# Patient Record
Sex: Female | Born: 1960 | Race: Black or African American | Hispanic: No | Marital: Single | Smoking: Former smoker
Health system: Southern US, Community
[De-identification: ages and names within clinical notes are randomized; demographics above are authoritative.]

## PROBLEM LIST (undated history)

## (undated) DIAGNOSIS — G43829 Menstrual migraine, not intractable, without status migrainosus: Secondary | ICD-10-CM

## (undated) DIAGNOSIS — K219 Gastro-esophageal reflux disease without esophagitis: Secondary | ICD-10-CM

## (undated) DIAGNOSIS — K449 Diaphragmatic hernia without obstruction or gangrene: Secondary | ICD-10-CM

## (undated) DIAGNOSIS — K635 Polyp of colon: Secondary | ICD-10-CM

## (undated) DIAGNOSIS — I10 Essential (primary) hypertension: Secondary | ICD-10-CM

## (undated) HISTORY — DX: Polyp of colon: K63.5

## (undated) HISTORY — DX: Gastro-esophageal reflux disease without esophagitis: K21.9

## (undated) HISTORY — PX: OTHER SURGICAL HISTORY: SHX169

## (undated) HISTORY — DX: Menstrual migraine, not intractable, without status migrainosus: G43.829

---

## 1991-01-20 HISTORY — PX: EXPLORATORY LAPAROTOMY: SUR591

## 1994-01-19 HISTORY — PX: ABDOMINAL HYSTERECTOMY: SHX81

## 2003-08-26 ENCOUNTER — Other Ambulatory Visit: Payer: Self-pay

## 2004-04-09 ENCOUNTER — Ambulatory Visit: Payer: Self-pay | Admitting: Internal Medicine

## 2004-05-09 ENCOUNTER — Ambulatory Visit: Payer: Self-pay | Admitting: Internal Medicine

## 2004-09-16 ENCOUNTER — Ambulatory Visit: Payer: Self-pay | Admitting: Internal Medicine

## 2004-10-31 ENCOUNTER — Ambulatory Visit: Payer: Self-pay | Admitting: Internal Medicine

## 2004-11-07 ENCOUNTER — Emergency Department: Payer: Self-pay | Admitting: Emergency Medicine

## 2004-12-25 ENCOUNTER — Ambulatory Visit: Payer: Self-pay | Admitting: Internal Medicine

## 2005-09-16 ENCOUNTER — Emergency Department: Payer: Self-pay | Admitting: Emergency Medicine

## 2005-09-16 ENCOUNTER — Other Ambulatory Visit: Payer: Self-pay

## 2006-05-21 ENCOUNTER — Ambulatory Visit: Payer: Self-pay | Admitting: Internal Medicine

## 2006-06-03 ENCOUNTER — Ambulatory Visit: Payer: Self-pay | Admitting: Internal Medicine

## 2006-07-13 ENCOUNTER — Other Ambulatory Visit: Payer: Self-pay

## 2006-07-13 ENCOUNTER — Emergency Department: Payer: Self-pay | Admitting: Emergency Medicine

## 2006-10-29 ENCOUNTER — Ambulatory Visit: Payer: Self-pay | Admitting: Unknown Physician Specialty

## 2006-12-03 ENCOUNTER — Ambulatory Visit: Payer: Self-pay | Admitting: Unknown Physician Specialty

## 2007-01-17 ENCOUNTER — Ambulatory Visit: Payer: Self-pay | Admitting: Physician Assistant

## 2007-01-26 ENCOUNTER — Ambulatory Visit: Payer: Self-pay | Admitting: Oncology

## 2007-07-20 ENCOUNTER — Ambulatory Visit: Payer: Self-pay | Admitting: Internal Medicine

## 2009-03-12 ENCOUNTER — Ambulatory Visit: Payer: Self-pay | Admitting: Internal Medicine

## 2010-03-07 ENCOUNTER — Other Ambulatory Visit: Payer: Self-pay | Admitting: Internal Medicine

## 2010-04-28 ENCOUNTER — Ambulatory Visit: Payer: Self-pay | Admitting: General Practice

## 2010-12-09 ENCOUNTER — Emergency Department: Payer: Self-pay | Admitting: Emergency Medicine

## 2010-12-31 ENCOUNTER — Ambulatory Visit: Payer: Self-pay | Admitting: Internal Medicine

## 2011-03-05 ENCOUNTER — Other Ambulatory Visit: Payer: Self-pay | Admitting: Internal Medicine

## 2011-03-05 LAB — CBC WITH DIFFERENTIAL/PLATELET
Basophil #: 0 10*3/uL (ref 0.0–0.1)
Eosinophil %: 1.7 %
HCT: 38.5 % (ref 35.0–47.0)
HGB: 12.6 g/dL (ref 12.0–16.0)
Lymphocyte #: 3 10*3/uL (ref 1.0–3.6)
MCV: 84 fL (ref 80–100)
Monocyte #: 0.5 10*3/uL (ref 0.0–0.7)
Monocyte %: 4.8 %
Neutrophil #: 5.9 10*3/uL (ref 1.4–6.5)
RDW: 14.9 % — ABNORMAL HIGH (ref 11.5–14.5)
WBC: 9.6 10*3/uL (ref 3.6–11.0)

## 2011-03-05 LAB — TSH: Thyroid Stimulating Horm: 0.824 u[IU]/mL

## 2011-03-05 LAB — URINALYSIS, COMPLETE
Bilirubin,UR: NEGATIVE
Glucose,UR: NEGATIVE mg/dL (ref 0–75)
Ketone: NEGATIVE
Leukocyte Esterase: NEGATIVE
Nitrite: NEGATIVE
Ph: 5 (ref 4.5–8.0)
Protein: NEGATIVE
Specific Gravity: 1.018 (ref 1.003–1.030)
WBC UR: 1 /HPF (ref 0–5)

## 2011-03-05 LAB — COMPREHENSIVE METABOLIC PANEL
Anion Gap: 5 — ABNORMAL LOW (ref 7–16)
Bilirubin,Total: 0.4 mg/dL (ref 0.2–1.0)
Chloride: 107 mmol/L (ref 98–107)
Co2: 28 mmol/L (ref 21–32)
Creatinine: 0.81 mg/dL (ref 0.60–1.30)
EGFR (African American): >60
EGFR (Non-African Amer.): >60
Osmolality: 279 (ref 275–301)
Potassium: 3.9 mmol/L (ref 3.5–5.1)
Sodium: 140 mmol/L (ref 136–145)

## 2011-03-05 LAB — LIPID PANEL
Cholesterol: 202 mg/dL — ABNORMAL HIGH (ref 0–200)
HDL Cholesterol: 52 mg/dL (ref 40–60)
Ldl Cholesterol, Calc: 133 mg/dL — ABNORMAL HIGH (ref 0–100)
Triglycerides: 83 mg/dL (ref 0–200)

## 2011-03-05 LAB — HEMOGLOBIN A1C: Hemoglobin A1C: 7.7 % — ABNORMAL HIGH (ref 4.2–6.3)

## 2012-02-19 ENCOUNTER — Ambulatory Visit: Payer: Self-pay | Admitting: Unknown Physician Specialty

## 2014-04-13 ENCOUNTER — Ambulatory Visit: Payer: Self-pay | Admitting: Family Medicine

## 2014-04-13 LAB — LIPID PANEL
Cholesterol: 198 mg/dL
HDL Cholesterol: 48 mg/dL
LDL CHOLESTEROL, CALC: 133 mg/dL — AB
TRIGLYCERIDES: 85 mg/dL
VLDL Cholesterol, Calc: 17 mg/dL

## 2014-04-13 LAB — COMPREHENSIVE METABOLIC PANEL
ALK PHOS: 80 U/L
ALT: 16 U/L
Albumin: 3.7 g/dL
Anion Gap: 6 — ABNORMAL LOW (ref 7–16)
BUN: 9 mg/dL
Bilirubin,Total: 0.4 mg/dL
CHLORIDE: 106 mmol/L
Calcium, Total: 9.1 mg/dL
Co2: 27 mmol/L
Creatinine: 0.71 mg/dL
EGFR (African American): >60
EGFR (Non-African Amer.): >60
GLUCOSE: 182 mg/dL — AB
Potassium: 3.8 mmol/L
SGOT(AST): 18 U/L
Sodium: 139 mmol/L
Total Protein: 7 g/dL

## 2014-04-13 LAB — HEMOGLOBIN A1C: Hemoglobin A1C: 7.4 % — ABNORMAL HIGH

## 2014-08-31 ENCOUNTER — Ambulatory Visit (INDEPENDENT_AMBULATORY_CARE_PROVIDER_SITE_OTHER): Payer: 59 | Admitting: Family Medicine

## 2014-08-31 ENCOUNTER — Encounter: Payer: Self-pay | Admitting: Family Medicine

## 2014-08-31 VITALS — BP 138/83 | HR 78 | Temp 97.7°F | Resp 16 | Ht 66.0 in | Wt 202.8 lb

## 2014-08-31 DIAGNOSIS — R1013 Epigastric pain: Secondary | ICD-10-CM | POA: Diagnosis not present

## 2014-08-31 DIAGNOSIS — K219 Gastro-esophageal reflux disease without esophagitis: Secondary | ICD-10-CM | POA: Insufficient documentation

## 2014-08-31 DIAGNOSIS — E119 Type 2 diabetes mellitus without complications: Secondary | ICD-10-CM | POA: Diagnosis not present

## 2014-08-31 MED ORDER — METFORMIN HCL 500 MG PO TABS: 500.0000 mg | ORAL_TABLET | Freq: Two times a day (BID) | ORAL | Status: DC

## 2014-08-31 MED ORDER — PANTOPRAZOLE SODIUM 40 MG PO TBEC: 40.0000 mg | DELAYED_RELEASE_TABLET | Freq: Every day | ORAL | Status: DC

## 2014-08-31 NOTE — Progress Notes (Signed)
Subjective:    Patient ID: Angela Griffith, female    DOB: [birth year], 54 y.o.   MRN: [id number]  HPI: Angela Griffith is a 54 y.o. female presenting on 08/31/2014 for Gastrophageal Reflux   Gastrophageal Reflux She complains of abdominal pain (epigastric), chest pain (chest pressure.), heartburn and nausea. This is a chronic problem. The problem has been gradually worsening. The heartburn is located in the substernum. The heartburn does not wake her from sleep. The heartburn does not limit her activity. The heartburn doesn't change with position. The symptoms are aggravated by certain foods and caffeine. Pertinent negatives include no anemia, fatigue or melena. Risk factors include smoking/tobacco exposure. She has tried nothing for the symptoms.   Pt reporting pain is located in the epigastrium.  At times the pain becomes a pressure and radiates up her chest to her head. She reports hearing her heartbeat in her ears.  This sensation is also accompanied by nausea.   Of note patient lab work that was never done in [facility] but appears in [facility] shows a HgA1c consistent with diabetes. Previous A1c was 7.7%.  Pt was unaware of diabetes diagnosis. No visual changes. No polydipsia, polyuria, or polyphagia. Occasional tingling in feet.   Past Medical History  Diagnosis Date  . GERD (gastroesophageal reflux disease)   . Colon polyps     last colonoscopy in 2015 nml  . Menstrual migraine     No current outpatient prescriptions on file prior to visit.   No current facility-administered medications on file prior to visit.    Review of Systems  Constitutional: Negative for fever, chills and fatigue.  HENT: Negative.   Cardiovascular: Positive for chest pain (chest pressure.) and palpitations.  Gastrointestinal: Positive for heartburn, nausea and abdominal pain (epigastric). Negative for vomiting, diarrhea, constipation, blood in stool and melena.  Endocrine: Negative for polydipsia,  polyphagia and polyuria.  Genitourinary: Negative.   Musculoskeletal: Negative.   Neurological: Negative for dizziness, weakness and light-headedness.  Psychiatric/Behavioral: Negative.    Per HPI unless specifically indicated above     Objective:    BP 138/83 mmHg  Pulse 78  Temp(Src) 97.7 F (36.5 C) (Oral)  Resp 16  Ht 5' 6"  (1.676 m)  Wt 202 lb 12.8 oz (91.989 kg)  BMI 32.75 kg/m2  LMP   Wt Readings from Last 3 Encounters:  08/31/14 202 lb 12.8 oz (91.989 kg)    Physical Exam  Constitutional: She is oriented to person, place, and time. She appears well-developed and well-nourished. No distress.  Neck: Normal range of motion. Neck supple. No thyromegaly present.  Cardiovascular: Normal rate and regular rhythm.  Exam reveals no gallop and no friction rub.   No murmur heard. Pulmonary/Chest: Effort normal and breath sounds normal. She exhibits no tenderness.  Abdominal: Soft. Bowel sounds are normal. There is no tenderness. There is no rebound.  Musculoskeletal: Normal range of motion. She exhibits no edema or tenderness.  Lymphadenopathy:    She has no cervical adenopathy.  Neurological: She is alert and oriented to person, place, and time.  Skin: Skin is warm and dry. She is not diaphoretic.   Diabetic Foot Exam - Simple   Simple Foot Form  Diabetic Foot exam was performed with the following findings:  Yes 08/31/2014  9:58 AM  Visual Inspection  No deformities, no ulcerations, no other skin breakdown bilaterally:  Yes  Sensation Testing  Intact to touch and monofilament testing bilaterally:  Yes  Pulse Check  Posterior Tibialis and  Dorsalis pulse intact bilaterally:  Yes  Comments      Results for orders placed or performed in visit on 04/13/14  Hemoglobin A1c  Result Value Ref Range   Hemoglobin A1C 7.4 (H) %  Comprehensive metabolic panel  Result Value Ref Range   Glucose, CSF DNP mg/dL   BUN 9 mg/dL   Creatinine 0.71 mg/dL   Sodium, Urine Random DNP  mmol/L   Potassium, Urine Random DNP mmol/L   Chloride, Urine Random DNP mmol/L   Co2 27 mmol/L   Calcium, Total 9.1 mg/dL   SGOT(AST) 18 U/L   SGPT (ALT) 16 U/L   Alkaline Phosphatase 80 U/L   Albumin 3.7 g/dL   Total Protein 7.0 g/dL   Bilirubin,Total 0.4 mg/dL   Anion Gap 6 (L) 7-16   EGFR (African American) >60    EGFR (Non-African Amer.) >60    Glucose 182 (H) mg/dL   Sodium 139 mmol/L   Potassium 3.8 mmol/L   Chloride 106 mmol/L  Lipid panel  Result Value Ref Range   Cholesterol 198 mg/dL   Triglycerides 85 mg/dL   HDL Cholesterol 48 mg/dL   VLDL Cholesterol, Calc 17 mg/dL   Ldl Cholesterol, Calc 133 (H) mg/dL      Assessment & Plan:   Problem List Items Addressed This Visit      Digestive   GERD without esophagitis - Primary    Start pantoprazole today. Reviewed trigger foods. Reviewed alarm symptoms of dysphagia, regurgitation, bloody vomit/stool.  RTC 2 weeks for check. Consider GI referral if not improving.       Relevant Medications   pantoprazole (PROTONIX) 40 MG tablet   Other Relevant Orders   CBC with Differential/Platelet     Endocrine   Type 2 diabetes mellitus    New onset- pt unaware of previous A1c. Reviewed diabetes care. Refer to [facility] for Diabetes education. Start Metformin BID.  Encouraged diet and lifestyle changes.  RTC 3 mos.       Relevant Medications   metFORMIN (GLUCOPHAGE) 500 MG tablet   Other Relevant Orders   Comprehensive Metabolic Panel (CMET)   Lipid Profile   HgB A1c   Microalbumin, urine   Ambulatory referral to diabetic education    Other Visit Diagnoses    Epigastric pain        EKG WNL. Check Amylase/lipase. Likely GERD. If symptoms do not improve with PPI- consider cardiology referal. RTC 2 weeks.     Relevant Orders    EKG 12-Lead    Lipase    Amylase       Meds ordered this encounter  Medications  . pantoprazole (PROTONIX) 40 MG tablet    Sig: Take 1 tablet (40 mg total) by mouth daily.     Dispense:  30 tablet    Refill:  11    Order Specific Question:  Supervising Provider    Answer:  Arlis Porta [phone]  . metFORMIN (GLUCOPHAGE) 500 MG tablet    Sig: Take 1 tablet (500 mg total) by mouth 2 (two) times daily with a meal.    Dispense:  60 tablet    Refill:  11    Order Specific Question:  Supervising Provider    Answer:  Arlis Porta [phone]      Follow up plan: Return in about 2 weeks (around 09/14/2014) for GERD. Marland Kitchen

## 2014-08-31 NOTE — Assessment & Plan Note (Signed)
New onset- pt unaware of previous A1c. Reviewed diabetes care. Refer to [facility] for Diabetes education. Start Metformin BID.  Encouraged diet and lifestyle changes.  RTC 3 mos.

## 2014-08-31 NOTE — Patient Instructions (Addendum)
Please seek immediate medical attention at ER or Urgent Care if you develop: Chest pain, pressure or tightness. Shortness of breath accompanied by nausea or diaphoresis Visual changes Numbness or tingling on one side of the body Facial droop Altered mental status Or any concerning symptoms.  We will try acid reflux medications for about 2 weeks. During that time if you experience chest pressure or palpitations- you must go to the ER.  If you are still having symptoms at your follow-up we will have you seen by cardiology.    We will refer you over to Diabetes education to learn more about your new diagnosis. Please get your eye exam this year.    Diabetes Mellitus and Food It is important for you to manage your blood sugar (glucose) level. Your blood glucose level can be greatly affected by what you eat. Eating healthier foods in the appropriate amounts throughout the day at about the same time each day will help you control your blood glucose level. It can also help slow or prevent worsening of your diabetes mellitus. Healthy eating may even help you improve the level of your blood pressure and reach or maintain a healthy weight.  HOW CAN FOOD AFFECT ME? Carbohydrates Carbohydrates affect your blood glucose level more than any other type of food. Your dietitian will help you determine how many carbohydrates to eat at each meal and teach you how to count carbohydrates. Counting carbohydrates is important to keep your blood glucose at a healthy level, especially if you are using insulin or taking certain medicines for diabetes mellitus. Alcohol Alcohol can cause sudden decreases in blood glucose (hypoglycemia), especially if you use insulin or take certain medicines for diabetes mellitus. Hypoglycemia can be a life-threatening condition. Symptoms of hypoglycemia (sleepiness, dizziness, and disorientation) are similar to symptoms of having too much alcohol.  If your health care provider has given  you approval to drink alcohol, do so in moderation and use the following guidelines:  Women should not have more than one drink per day, and men should not have more than two drinks per day. One drink is equal to:  12 oz of beer.  5 oz of wine.  1 oz of hard liquor.  Do not drink on an empty stomach.  Keep yourself hydrated. Have water, diet soda, or unsweetened iced tea.  Regular soda, juice, and other mixers might contain a lot of carbohydrates and should be counted. WHAT FOODS ARE NOT RECOMMENDED? As you make food choices, it is important to remember that all foods are not the same. Some foods have fewer nutrients per serving than other foods, even though they might have the same number of calories or carbohydrates. It is difficult to get your body what it needs when you eat foods with fewer nutrients. Examples of foods that you should avoid that are high in calories and carbohydrates but low in nutrients include:  Trans fats (most processed foods list trans fats on the Nutrition Facts label).  Regular soda.  Juice.  Candy.  Sweets, such as cake, pie, doughnuts, and cookies.  Fried foods. WHAT FOODS CAN I EAT? Have nutrient-rich foods, which will nourish your body and keep you healthy. The food you should eat also will depend on several factors, including:  The calories you need.  The medicines you take.  Your weight.  Your blood glucose level.  Your blood pressure level.  Your cholesterol level. You also should eat a variety of foods, including:  Protein, such as meat,  poultry, fish, tofu, nuts, and seeds (lean animal proteins are best).  Fruits.  Vegetables.  Dairy products, such as milk, cheese, and yogurt (low fat is best).  Breads, grains, pasta, cereal, rice, and beans.  Fats such as olive oil, trans fat-free margarine, canola oil, avocado, and olives. DOES EVERYONE WITH DIABETES MELLITUS HAVE THE SAME MEAL PLAN? Because every person with diabetes  mellitus is different, there is not one meal plan that works for everyone. It is very important that you meet with a dietitian who will help you create a meal plan that is just right for you. Document Released: 10/02/2004 Document Revised: 01/10/2013 Document Reviewed: 12/02/2012 [facility] Patient Information 2015 [place], [place]. This information is not intended to replace advice given to you by your health care provider. Make sure you discuss any questions you have with your health care provider. Diabetes Mellitus and Food It is important for you to manage your blood sugar (glucose) level. Your blood glucose level can be greatly affected by what you eat. Eating healthier foods in the appropriate amounts throughout the day at about the same time each day will help you control your blood glucose level. It can also help slow or prevent worsening of your diabetes mellitus. Healthy eating may even help you improve the level of your blood pressure and reach or maintain a healthy weight.  HOW CAN FOOD AFFECT ME? Carbohydrates Carbohydrates affect your blood glucose level more than any other type of food. Your dietitian will help you determine how many carbohydrates to eat at each meal and teach you how to count carbohydrates. Counting carbohydrates is important to keep your blood glucose at a healthy level, especially if you are using insulin or taking certain medicines for diabetes mellitus. Alcohol Alcohol can cause sudden decreases in blood glucose (hypoglycemia), especially if you use insulin or take certain medicines for diabetes mellitus. Hypoglycemia can be a life-threatening condition. Symptoms of hypoglycemia (sleepiness, dizziness, and disorientation) are similar to symptoms of having too much alcohol.  If your health care provider has given you approval to drink alcohol, do so in moderation and use the following guidelines:  Women should not have more than one drink per day, and men should not have  more than two drinks per day. One drink is equal to:  12 oz of beer.  5 oz of wine.  1 oz of hard liquor.  Do not drink on an empty stomach.  Keep yourself hydrated. Have water, diet soda, or unsweetened iced tea.  Regular soda, juice, and other mixers might contain a lot of carbohydrates and should be counted. WHAT FOODS ARE NOT RECOMMENDED? As you make food choices, it is important to remember that all foods are not the same. Some foods have fewer nutrients per serving than other foods, even though they might have the same number of calories or carbohydrates. It is difficult to get your body what it needs when you eat foods with fewer nutrients. Examples of foods that you should avoid that are high in calories and carbohydrates but low in nutrients include:  Trans fats (most processed foods list trans fats on the Nutrition Facts label).  Regular soda.  Juice.  Candy.  Sweets, such as cake, pie, doughnuts, and cookies.  Fried foods. WHAT FOODS CAN I EAT? Have nutrient-rich foods, which will nourish your body and keep you healthy. The food you should eat also will depend on several factors, including:  The calories you need.  The medicines you take.  Your  weight.  Your blood glucose level.  Your blood pressure level.  Your cholesterol level. You also should eat a variety of foods, including:  Protein, such as meat, poultry, fish, tofu, nuts, and seeds (lean animal proteins are best).  Fruits.  Vegetables.  Dairy products, such as milk, cheese, and yogurt (low fat is best).  Breads, grains, pasta, cereal, rice, and beans.  Fats such as olive oil, trans fat-free margarine, canola oil, avocado, and olives. DOES EVERYONE WITH DIABETES MELLITUS HAVE THE SAME MEAL PLAN? Because every person with diabetes mellitus is different, there is not one meal plan that works for everyone. It is very important that you meet with a dietitian who will help you create a meal plan  that is just right for you. Document Released: 10/02/2004 Document Revised: 01/10/2013 Document Reviewed: 12/02/2012 [facility] Patient Information 2015 [place], [place]. This information is not intended to replace advice given to you by your health care provider. Make sure you discuss any questions you have with your health care provider.

## 2014-08-31 NOTE — Assessment & Plan Note (Signed)
Start pantoprazole today. Reviewed trigger foods. Reviewed alarm symptoms of dysphagia, regurgitation, bloody vomit/stool.  RTC 2 weeks for check. Consider GI referral if not improving.

## 2014-09-06 ENCOUNTER — Other Ambulatory Visit
Admission: RE | Admit: 2014-09-06 | Discharge: 2014-09-06 | Disposition: A | Discharge status: Home / Self Care | Payer: 59 | Source: Ambulatory Visit | Attending: Family Medicine | Admitting: Family Medicine

## 2014-09-06 DIAGNOSIS — R1013 Epigastric pain: Secondary | ICD-10-CM | POA: Insufficient documentation

## 2014-09-06 DIAGNOSIS — E119 Type 2 diabetes mellitus without complications: Secondary | ICD-10-CM | POA: Insufficient documentation

## 2014-09-06 DIAGNOSIS — K219 Gastro-esophageal reflux disease without esophagitis: Secondary | ICD-10-CM | POA: Insufficient documentation

## 2014-09-07 DIAGNOSIS — R1013 Epigastric pain: Secondary | ICD-10-CM | POA: Diagnosis present

## 2014-09-07 DIAGNOSIS — E119 Type 2 diabetes mellitus without complications: Secondary | ICD-10-CM | POA: Diagnosis present

## 2014-09-07 DIAGNOSIS — K219 Gastro-esophageal reflux disease without esophagitis: Secondary | ICD-10-CM | POA: Diagnosis not present

## 2014-09-07 LAB — COMPREHENSIVE METABOLIC PANEL
ALK PHOS: 79 U/L (ref 38–126)
ALT: 17 U/L (ref 14–54)
AST: 24 U/L (ref 15–41)
Albumin: 3.9 g/dL (ref 3.5–5.0)
Anion gap: 6 (ref 5–15)
BUN: 11 mg/dL (ref 6–20)
CALCIUM: 9 mg/dL (ref 8.9–10.3)
CO2: 27 mmol/L (ref 22–32)
CREATININE: 0.77 mg/dL (ref 0.44–1.00)
Chloride: 106 mmol/L (ref 101–111)
Glucose, Bld: 123 mg/dL — ABNORMAL HIGH (ref 65–99)
Potassium: 4 mmol/L (ref 3.5–5.1)
Sodium: 139 mmol/L (ref 135–145)
Total Bilirubin: 0.4 mg/dL (ref 0.3–1.2)
Total Protein: 7.4 g/dL (ref 6.5–8.1)

## 2014-09-07 LAB — CBC WITH DIFFERENTIAL/PLATELET
BASOS ABS: 0.1 10*3/uL (ref 0–0.1)
Basophils Relative: 1 %
EOS ABS: 0.1 10*3/uL (ref 0–0.7)
EOS PCT: 1 %
HCT: 39.5 % (ref 35.0–47.0)
HEMOGLOBIN: 12.7 g/dL (ref 12.0–16.0)
LYMPHS ABS: 3.4 10*3/uL (ref 1.0–3.6)
Lymphocytes Relative: 32 %
MCH: 26.1 pg (ref 26.0–34.0)
MCHC: 32.1 g/dL (ref 32.0–36.0)
MCV: 81.4 fL (ref 80.0–100.0)
Monocytes Absolute: 0.7 10*3/uL (ref 0.2–0.9)
Monocytes Relative: 7 %
NEUTROS PCT: 59 %
Neutro Abs: 6.4 10*3/uL (ref 1.4–6.5)
PLATELETS: 340 10*3/uL (ref 150–440)
RBC: 4.85 MIL/uL (ref 3.80–5.20)
RDW: 14.7 % — ABNORMAL HIGH (ref 11.5–14.5)
WBC: 10.7 10*3/uL (ref 3.6–11.0)

## 2014-09-07 LAB — LIPID PANEL
Cholesterol: 215 mg/dL — ABNORMAL HIGH (ref 0–200)
HDL: 56 mg/dL (ref >40)
LDL CALC: 148 mg/dL — AB (ref 0–99)
TRIGLYCERIDES: 55 mg/dL (ref <150)
Total CHOL/HDL Ratio: 3.8 RATIO
VLDL: 11 mg/dL (ref 0–40)

## 2014-09-07 LAB — LIPASE, BLOOD: LIPASE: 20 U/L — AB (ref 22–51)

## 2014-09-07 LAB — HEMOGLOBIN A1C: HEMOGLOBIN A1C: 7.6 % — AB (ref 4.0–6.0)

## 2014-09-07 LAB — AMYLASE: AMYLASE: 53 U/L (ref 28–100)

## 2014-09-08 LAB — MICROALBUMIN, URINE: MICROALB UR: 10 ug/mL — AB

## 2014-09-10 ENCOUNTER — Telehealth: Payer: Self-pay | Admitting: Family Medicine

## 2014-09-10 ENCOUNTER — Ambulatory Visit: Payer: Self-pay | Admitting: *Deleted

## 2014-09-10 NOTE — Telephone Encounter (Signed)
Called pt to review lab results. LMTCB.   HgA1c is 7.6%- still consistent with diabetes. We will continue the treatment we started at your last visit. Kidney function and liver function are doing well. Your urine does show so microscopic protein- this is common in diabetes.  The treatment for this is placing you on a blood pressure medication called Lisinopril or Losartan to protect your kidneys. We will talk more about this at your visit.  CBC: No anemia of signs of bleeding from acid reflux.

## 2014-09-11 NOTE — Telephone Encounter (Signed)
Pt made aware of lab results. Protein in kidneys and possible start of bp medication To be discussed at next visit.

## 2014-09-13 ENCOUNTER — Ambulatory Visit: Payer: Self-pay | Admitting: *Deleted

## 2014-09-14 ENCOUNTER — Ambulatory Visit: Payer: 59 | Admitting: Family Medicine

## 2014-09-17 ENCOUNTER — Ambulatory Visit: Payer: Self-pay | Admitting: *Deleted

## 2014-09-18 ENCOUNTER — Telehealth: Payer: Self-pay

## 2014-09-18 NOTE — Telephone Encounter (Signed)
Agree, as we discussed.-jh 

## 2014-09-18 NOTE — Telephone Encounter (Signed)
Pt was feeling dizzy, jittery  Thinks may be the  side effect of  Metformin that she start on 09/13/2014 and As per Dr. Juanetta Gosling she can take once daily instead of twice daily and make sure has enough food because pt is not taking with enough food we also mentioned  to check her sugar level when she is feeling that way and she checked her sugar was 121 with 2 cracker and cup of coffee and wanted to be seen today --as per Dr. Juanetta Gosling we can see her Thursday and if she feels really sick today she can be seen by ER or Urgent care pt understood very well.

## 2014-09-21 ENCOUNTER — Ambulatory Visit: Payer: Self-pay | Admitting: *Deleted

## 2014-10-05 ENCOUNTER — Encounter: Payer: Self-pay | Admitting: *Deleted

## 2014-10-05 ENCOUNTER — Encounter: Payer: 59 | Attending: Family Medicine | Admitting: *Deleted

## 2014-10-05 VITALS — BP 144/90 | Ht 65.0 in | Wt 202.2 lb

## 2014-10-05 DIAGNOSIS — E119 Type 2 diabetes mellitus without complications: Secondary | ICD-10-CM | POA: Diagnosis not present

## 2014-10-05 NOTE — Progress Notes (Signed)
Diabetes Self-Management Education  Visit Type: First/Initial  Appt. Start Time: 1335 Appt. End Time: 1450  10/05/2014  Ms. Angela Griffith, identified by name and date of birth, is a 54 y.o. female with a diagnosis of Diabetes: Type 2.   ASSESSMENT  Blood pressure 144/90, height  (1.651 m), weight 202 lb 3.2 oz (91.717 kg). Body mass index is 33.65 kg/(m^2).      Diabetes Self-Management Education - 10/05/14 1522    Visit Information   Visit Type First/Initial   Initial Visit   Diabetes Type Type 2   Are you currently following a meal plan? Yes   What type of meal plan do you follow? eating more fruits and vegetables; not eating late at night   Are you taking your medications as prescribed? Yes  Someitmes she only takes 1 Metformin per day due to "not feeling well when I take 1000 mg"   Date Diagnosed 3 weeks ago   Health Coping   How would you rate your overall health? Good   Psychosocial Assessment   Patient Belief/Attitude about Diabetes Motivated to manage diabetes  "not worried"   Self-care barriers None   Self-management support Friends;Family   Patient Concerns Nutrition/Meal planning;Medication;Monitoring;Glycemic Control;Weight Control;Healthy Lifestyle   Special Needs None   Preferred Learning Style Hands on;Auditory   Learning Readiness Change in progress   How often do you need to have someone help you when you read instructions, pamphlets, or other written materials from your doctor or pharmacy? 1 - Never   What is the last grade level you completed in school? college   Complications   Last HgB A1C per patient/outside source 7.6 %  09/07/14   How often do you check your blood sugar? 0 times/day (not testing)  Provided FreeStyle Freedom Lite meter and instructed on use. BG upon return demonstration was 87 mg/dL at 1:61 pm - 3 hrs pp.     Have you had a dilated eye exam in the past 12 months? No   Have you had a dental exam in the past 12 months? No   Are you checking your feet? Yes   How many days per week are you checking your feet? 1   Dietary Intake   Breakfast skips meals; oatmeal; bacon, egg and cheese with wheat toast; peanut butter crackers or cheese crackers   Lunch [place] burger and occasional [place] fries; chicken pie; pasta, [place] wrap   Dinner spaghetti; veggies; [place] sandwich   Beverage(s) water, juices   Exercise   Exercise Type Light (walking / raking leaves)   How many days per week to you exercise? 2   How many minutes per day do you exercise? 30   Total minutes per week of exercise 60   Patient Education   Previous Diabetes Education No   Disease state  Definition of diabetes, type 1 and 2, and the diagnosis of diabetes;Factors that contribute to the development of diabetes   Nutrition management  Role of diet in the treatment of diabetes and the relationship between the three main macronutrients and blood glucose level   Physical activity and exercise  Role of exercise on diabetes management, blood pressure control and cardiac health.   Medications Reviewed patients medication for diabetes, action, purpose, timing of dose and side effects.   Monitoring Taught/evaluated SMBG meter.;Purpose and frequency of SMBG.;Identified appropriate SMBG and/or A1C goals.   Chronic complications Relationship between chronic complications and blood glucose control   Psychosocial adjustment Identified and addressed patients  feelings and concerns about diabetes   Individualized Goals (developed by patient)   Reducing Risk Improve blood sugars Decrease medications Lose weight Lead a healthier lifestyle Become more fit   Outcomes   Expected Outcomes Demonstrated interest in learning. Expect positive outcomes      Individualized Plan for Diabetes Self-Management Training:   Learning Objective:  Patient will have a greater understanding of diabetes self-management. Patient education plan is to attend individual and/or group  sessions per assessed needs and concerns.   Plan:   Patient Instructions  Check blood sugars 2 x day before breakfast and 2 hrs after supper 3-4 x week Exercise: Continue at gym for  30 minutes 2 days a week and gradually increase to 30 minutes 5 x week Avoid sugar sweetened drinks (juices) Don't skip meals Eat 3 meals day,   1-2  snacks a day Space meals 4-6 hours apart Make an eye doctor appointment Bring blood sugar records to the next class Call your doctor for a prescription for:  1. Meter strips (type) FreeStyle Lite checking  1 times per day  2. Lancets (type) FreeStyle checking  1   times per day   Expected Outcomes:  Demonstrated interest in learning. Expect positive outcomes  Education material provided:  General Meal Planning Guidelines Meter - FreeStyle Freedom Lite  If problems or questions, patient to contact team via:   Sharion Settler, RN, CCM, CDE [phone]  Future DSME appointment:  December 17, 2014 for Class 1

## 2014-10-05 NOTE — Patient Instructions (Addendum)
Check blood sugars 2 x day before breakfast and 2 hrs after supper 3-4 x week Exercise: Continue at gym for  30 minutes 2 days a week and gradually increase to 30 minutes 5 x week Avoid sugar sweetened drinks (juices) Don't skip meals Eat 3 meals day,   1-2  snacks a day Space meals 4-6 hours apart Make an eye doctor appointment Bring blood sugar records to the next class Call your doctor for a prescription for:  1. Meter strips (type) FreeStyle Lite checking  1 times per day  2. Lancets (type) FreeStyle checking  1   times per day

## 2014-10-23 ENCOUNTER — Encounter: Payer: Self-pay | Admitting: *Deleted

## 2014-10-23 ENCOUNTER — Emergency Department
Admission: EM | Admit: 2014-10-23 | Discharge: 2014-10-24 | Disposition: A | Discharge status: Home / Self Care | Payer: 59 | Attending: Emergency Medicine | Admitting: Emergency Medicine

## 2014-10-23 ENCOUNTER — Other Ambulatory Visit: Payer: Self-pay

## 2014-10-23 ENCOUNTER — Emergency Department: Payer: 59

## 2014-10-23 DIAGNOSIS — I159 Secondary hypertension, unspecified: Secondary | ICD-10-CM | POA: Diagnosis not present

## 2014-10-23 DIAGNOSIS — Z79899 Other long term (current) drug therapy: Secondary | ICD-10-CM | POA: Diagnosis not present

## 2014-10-23 DIAGNOSIS — R109 Unspecified abdominal pain: Secondary | ICD-10-CM | POA: Insufficient documentation

## 2014-10-23 DIAGNOSIS — R079 Chest pain, unspecified: Secondary | ICD-10-CM | POA: Diagnosis present

## 2014-10-23 DIAGNOSIS — E119 Type 2 diabetes mellitus without complications: Secondary | ICD-10-CM | POA: Insufficient documentation

## 2014-10-23 DIAGNOSIS — Z87891 Personal history of nicotine dependence: Secondary | ICD-10-CM | POA: Diagnosis not present

## 2014-10-23 LAB — BASIC METABOLIC PANEL
ANION GAP: 8 (ref 5–15)
BUN: 17 mg/dL (ref 6–20)
CHLORIDE: 106 mmol/L (ref 101–111)
CO2: 25 mmol/L (ref 22–32)
Calcium: 9.1 mg/dL (ref 8.9–10.3)
Creatinine, Ser: 0.81 mg/dL (ref 0.44–1.00)
GFR calc non Af Amer: >60 mL/min (ref >60)
Glucose, Bld: 148 mg/dL — ABNORMAL HIGH (ref 65–99)
Potassium: 3.6 mmol/L (ref 3.5–5.1)
Sodium: 139 mmol/L (ref 135–145)

## 2014-10-23 LAB — URINALYSIS COMPLETE WITH MICROSCOPIC (ARMC ONLY)
Bilirubin Urine: NEGATIVE
Glucose, UA: NEGATIVE mg/dL
KETONES UR: NEGATIVE mg/dL
LEUKOCYTES UA: NEGATIVE
Nitrite: NEGATIVE
PH: 7 (ref 5.0–8.0)
PROTEIN: NEGATIVE mg/dL
Specific Gravity, Urine: 1.003 — ABNORMAL LOW (ref 1.005–1.030)

## 2014-10-23 LAB — CBC
HEMATOCRIT: 38.3 % (ref 35.0–47.0)
HEMOGLOBIN: 12.2 g/dL (ref 12.0–16.0)
MCH: 25.9 pg — AB (ref 26.0–34.0)
MCHC: 31.8 g/dL — ABNORMAL LOW (ref 32.0–36.0)
MCV: 81.5 fL (ref 80.0–100.0)
Platelets: 357 10*3/uL (ref 150–440)
RBC: 4.7 MIL/uL (ref 3.80–5.20)
RDW: 14.8 % — ABNORMAL HIGH (ref 11.5–14.5)
WBC: 14.5 10*3/uL — ABNORMAL HIGH (ref 3.6–11.0)

## 2014-10-23 LAB — TROPONIN I: Troponin I: <0.03 ng/mL (ref <0.031)

## 2014-10-23 NOTE — ED Provider Notes (Signed)
[facility] Emergency Department Provider Note  ____________________________________________  Time seen: Approximately 11:15 PM  I have reviewed the triage vital signs and the nursing notes.   HISTORY  Chief Complaint Palpitations and Chest Pain    HPI Angela Griffith is a 54 y.o. female who presents to the ED from home with a chief complaint of "throbbing" in epigastrium and lower chest. Patient has experienced symptoms previously; this evening she was "running around" and experienced symptoms onset approximately 6 PM. Describes a sensation of burning and throbbing starting in her epigastrium and lower chest. Denies sensation of sharp, stabbing, tearing pain. Denies sensation of pressure or tightness. Does not radiate to her arms, shoulders or neck. Pain in epigastrium wraps around to her back. At times patient feels her pulse throbbing in her head as well as occasional palpitation which she describes as "a hard thump". Patient recently started on metformin and has had issues with adjusting her dosage secondary to side effects of nausea. She is currentlyon 500 mg twice daily. She admits that she does not take her pantoprazole scheduled as directed by her doctor. Denies recent fever, chills, cough, abdominal pain, nausea, vomiting, diarrhea. Denies recent travel or trauma. Denies hormone use. Denies use of energy drinks or excessive caffeine. Nothing makes her symptoms worse. Resting makes her symptoms slightly better.   Past Medical History  Diagnosis Date  . GERD (gastroesophageal reflux disease)   . Colon polyps     last colonoscopy in 2015 nml  . Menstrual migraine   . Diabetes mellitus without complication [facility])     Patient Active Problem List   Diagnosis Date Noted  . GERD without esophagitis 08/31/2014  . Type 2 diabetes mellitus (HCC) 08/31/2014    Past Surgical History  Procedure Laterality Date  . Abdominal hysterectomy    . Exploratory  laparotomy  1993    for endometriosis  . Cesarean section  1985 and 1988    Current Outpatient Rx  Name  Route  Sig  Dispense  Refill  . metFORMIN (GLUCOPHAGE) 500 MG tablet   Oral   Take 1 tablet (500 mg total) by mouth 2 (two) times daily with a meal.   60 tablet   11   . pantoprazole (PROTONIX) 40 MG tablet   Oral   Take 1 tablet (40 mg total) by mouth daily.   30 tablet   11     Allergies Sulfa antibiotics; Theophyllines; Codeine; and Demerol  Family History  Problem Relation Age of Onset  . Deep vein thrombosis Father   . Cancer Father     colon  . Stroke Father   . Anuerysm Maternal Grandmother   . Diabetes Mother   . Diabetes Maternal Aunt   . Diabetes Maternal Uncle     Social History Social History  Substance Use Topics  . Smoking status: Former Smoker -- 0.25 packs/day for 34 years    Types: Cigarettes    Quit date: 01/19/2014  . Smokeless tobacco: Former Neurosurgeon  . Alcohol Use: No    Review of Systems Constitutional: No fever/chills Eyes: No visual changes. ENT: No sore throat. Cardiovascular: Positive for chest pain. Respiratory: Denies shortness of breath. Gastrointestinal: Positive for abdominal pain.  No nausea, no vomiting.  No diarrhea.  No constipation. Genitourinary: Negative for dysuria. Musculoskeletal: Negative for back pain. Skin: Negative for rash. Neurological: Negative for headaches, focal weakness or numbness.  10-point ROS otherwise negative.  ____________________________________________   PHYSICAL EXAM:  VITAL SIGNS: ED  Triage Vitals  Enc Vitals Group     BP 10/23/14 2156 183/102 mmHg     Pulse Rate 10/23/14 2156 89     Resp 10/23/14 2156 18     Temp 10/23/14 2156 98.4 F (36.9 C)     Temp Source 10/23/14 2156 Oral     SpO2 10/23/14 2156 98 %     Weight --      Height --      Head Cir --      Peak Flow --      Pain Score 10/23/14 2150 6     Pain Loc --      Pain Edu? --      Excl. in GC? --      Constitutional: Alert and oriented. Well appearing and in no acute distress. Eyes: Conjunctivae are normal. PERRL. EOMI. Head: Atraumatic. Nose: No congestion/rhinnorhea. Mouth/Throat: Mucous membranes are moist.  Oropharynx non-erythematous. Neck: No stridor. No carotid bruits. Cardiovascular: Normal rate, regular rhythm. Grossly normal heart sounds.  Good peripheral circulation. Respiratory: Normal respiratory effort.  No retractions. Lungs CTAB. Gastrointestinal: Soft and mildly tender to epigastrium without rebound or guarding. No distention. No abdominal bruits. No CVA tenderness. Musculoskeletal: No lower extremity tenderness nor edema.  No joint effusions. Neurologic:  Normal speech and language. No gross focal neurologic deficits are appreciated. No gait instability. Skin:  Skin is warm, dry and intact. No rash noted. Psychiatric: Mood and affect are normal. Speech and behavior are normal.  ____________________________________________   LABS (all labs ordered are listed, but only abnormal results are displayed)  Labs Reviewed  BASIC METABOLIC PANEL - Abnormal; Notable for the following:    Glucose, Bld 148 (*)    All other components within normal limits  CBC - Abnormal; Notable for the following:    WBC 14.5 (*)    MCH 25.9 (*)    MCHC 31.8 (*)    RDW 14.8 (*)    All other components within normal limits  URINALYSIS COMPLETEWITH MICROSCOPIC ([facility] ONLY) - Abnormal; Notable for the following:    Color, Urine COLORLESS (*)    APPearance CLEAR (*)    Specific Gravity, Urine 1.003 (*)    Hgb urine dipstick 1+ (*)    Bacteria, UA RARE (*)    Squamous Epithelial / LPF 0-5 (*)    All other components within normal limits  HEPATIC FUNCTION PANEL - Abnormal; Notable for the following:    Bilirubin, Direct <0.1 (*)    All other components within normal limits  TROPONIN I  LIPASE, BLOOD   ____________________________________________  EKG  ED ECG REPORT I, Ellayna Hilligoss  J, the attending physician, personally viewed and interpreted this ECG.   Date: 10/24/2014  EKG Time: 2148  Rate: 91  Rhythm: normal EKG, normal sinus rhythm  Axis: Normal  Intervals:none  ST&T Change: Nonspecific  ____________________________________________  RADIOLOGY  Chest 2 view (viewed by me, interpreted per Dr. Cherly Hensen): No acute cardiopulmonary process seen.  US Abdomen Limited RUQ interpreted per Dr. Cherly Hensen: Unremarkable ultrasound of the right upper quadrant.  ____________________________________________   PROCEDURES  Procedure(s) performed: None  Critical Care performed: No  ____________________________________________   INITIAL IMPRESSION / ASSESSMENT AND PLAN / ED COURSE  Pertinent labs & imaging results that were available during my care of the patient were reviewed by me and considered in my medical decision making (see chart for details).  54 year old female who presents with epigastric burning and substernal chest pain. She is hypertensive and mildly tender to  palpation on exam in her epigastrium. Will initiate symptomatic treatment with IV Protonix; obtain ultrasound to evaluate for cholecystitis, and reassess.  ----------------------------------------- 2:30 AM on 10/24/2014 -----------------------------------------  Patient resting in no acute distress. States she feels better overall. Discussed with patient persistently elevated blood pressure; specifically discussed starting HCTZ which she agrees to starting a low dose. Will start HCTZ 25 mg daily and patient will follow-up with her PCP. Strict return precautions given. Patient verbalizes understanding and agrees with plan of care. ____________________________________________   FINAL CLINICAL IMPRESSION(S) / ED DIAGNOSES  Final diagnoses:  Secondary hypertension, unspecified      Irean Hong, MD 10/24/14 0740

## 2014-10-23 NOTE — ED Notes (Addendum)
Pt reports onset of throbbing in upper abdomen and chest this afternoon/evening. Also feels at times has had palpitations. HR 91 in triage. States pain is throbbing in epigastric region into lower chest.

## 2014-10-23 NOTE — ED Notes (Signed)
Patient has "throbbing" in her chest. Has started on metformin and omeprazole.

## 2014-10-24 ENCOUNTER — Emergency Department: Payer: 59

## 2014-10-24 LAB — HEPATIC FUNCTION PANEL
ALBUMIN: 3.9 g/dL (ref 3.5–5.0)
ALT: 17 U/L (ref 14–54)
AST: 21 U/L (ref 15–41)
Alkaline Phosphatase: 67 U/L (ref 38–126)
BILIRUBIN TOTAL: 0.5 mg/dL (ref 0.3–1.2)
Total Protein: 7.3 g/dL (ref 6.5–8.1)

## 2014-10-24 LAB — LIPASE, BLOOD: Lipase: 26 U/L (ref 22–51)

## 2014-10-24 MED ORDER — PANTOPRAZOLE SODIUM 40 MG IV SOLR
40.0000 mg | Freq: Once | INTRAVENOUS | Status: AC
  Administered 2014-10-24: 40 mg via INTRAVENOUS
  Filled 2014-10-24: qty 40

## 2014-10-24 MED ORDER — HYDROCHLOROTHIAZIDE 25 MG PO TABS: 25.0000 mg | ORAL_TABLET | Freq: Every day | ORAL | Status: DC

## 2014-10-24 NOTE — Discharge Instructions (Signed)
1. Start blood pressure medicine daily (HCTZ 25 mg #30). 2. Return to the ER for recurrent or worsening symptoms, persistent vomiting, difficulty breathing or other concerns.  Hypertension Hypertension, commonly called high blood pressure, is when the force of blood pumping through your arteries is too strong. Your arteries are the blood vessels that carry blood from your heart throughout your body. A blood pressure reading consists of a higher number over a lower number, such as 110/72. The higher number (systolic) is the pressure inside your arteries when your heart pumps. The lower number (diastolic) is the pressure inside your arteries when your heart relaxes. Ideally you want your blood pressure below 120/80. Hypertension forces your heart to work harder to pump blood. Your arteries may become narrow or stiff. Having untreated or uncontrolled hypertension can cause heart attack, stroke, kidney disease, and other problems. RISK FACTORS Some risk factors for high blood pressure are controllable. Others are not.  Risk factors you cannot control include:   Race. You may be at higher risk if you are African American.  Age. Risk increases with age.  Gender. Men are at higher risk than women before age 67 years. After age 107, women are at higher risk than men. Risk factors you can control include:  Not getting enough exercise or physical activity.  Being overweight.  Getting too much fat, sugar, calories, or salt in your diet.  Drinking too much alcohol. SIGNS AND SYMPTOMS Hypertension does not usually cause signs or symptoms. Extremely high blood pressure (hypertensive crisis) may cause headache, anxiety, shortness of breath, and nosebleed. DIAGNOSIS To check if you have hypertension, your health care provider will measure your blood pressure while you are seated, with your arm held at the level of your heart. It should be measured at least twice using the same arm. Certain conditions can  cause a difference in blood pressure between your right and left arms. A blood pressure reading that is higher than normal on one occasion does not mean that you need treatment. If it is not clear whether you have high blood pressure, you may be asked to return on a different day to have your blood pressure checked again. Or, you may be asked to monitor your blood pressure at home for 1 or more weeks. TREATMENT Treating high blood pressure includes making lifestyle changes and possibly taking medicine. Living a healthy lifestyle can help lower high blood pressure. You may need to change some of your habits. Lifestyle changes may include:  Following the DASH diet. This diet is high in fruits, vegetables, and whole grains. It is low in salt, red meat, and added sugars.  Keep your sodium intake below 2,300 mg per day.  Getting at least 30-45 minutes of aerobic exercise at least 4 times per week.  Losing weight if necessary.  Not smoking.  Limiting alcoholic beverages.  Learning ways to reduce stress. Your health care provider may prescribe medicine if lifestyle changes are not enough to get your blood pressure under control, and if one of the following is true:  You are 30-60 years of age and your systolic blood pressure is above 140.  You are 66 years of age or older, and your systolic blood pressure is above 150.  Your diastolic blood pressure is above 90.  You have diabetes, and your systolic blood pressure is over 140 or your diastolic blood pressure is over 90.  You have kidney disease and your blood pressure is above 140/90.  You have heart  disease and your blood pressure is above 140/90. Your personal target blood pressure may vary depending on your medical conditions, your age, and other factors. HOME CARE INSTRUCTIONS  Have your blood pressure rechecked as directed by your health care provider.   Take medicines only as directed by your health care provider. Follow the  directions carefully. Blood pressure medicines must be taken as prescribed. The medicine does not work as well when you skip doses. Skipping doses also puts you at risk for problems.  Do not smoke.   Monitor your blood pressure at home as directed by your health care provider. SEEK MEDICAL CARE IF:   You think you are having a reaction to medicines taken.  You have recurrent headaches or feel dizzy.  You have swelling in your ankles.  You have trouble with your vision. SEEK IMMEDIATE MEDICAL CARE IF:  You develop a severe headache or confusion.  You have unusual weakness, numbness, or feel faint.  You have severe chest or abdominal pain.  You vomit repeatedly.  You have trouble breathing. MAKE SURE YOU:   Understand these instructions.  Will watch your condition.  Will get help right away if you are not doing well or get worse.   This information is not intended to replace advice given to you by your health care provider. Make sure you discuss any questions you have with your health care provider.   Document Released: 01/05/2005 Document Revised: 05/22/2014 Document Reviewed: 10/28/2012 Elsevier Interactive Patient Education [employer]

## 2014-10-26 ENCOUNTER — Ambulatory Visit (INDEPENDENT_AMBULATORY_CARE_PROVIDER_SITE_OTHER): Payer: 59 | Admitting: Family Medicine

## 2014-10-26 ENCOUNTER — Encounter: Payer: Self-pay | Admitting: Family Medicine

## 2014-10-26 VITALS — BP 160/100 | HR 72 | Temp 98.4°F | Resp 16 | Ht 66.0 in | Wt 203.6 lb

## 2014-10-26 DIAGNOSIS — K219 Gastro-esophageal reflux disease without esophagitis: Secondary | ICD-10-CM | POA: Diagnosis not present

## 2014-10-26 DIAGNOSIS — E119 Type 2 diabetes mellitus without complications: Secondary | ICD-10-CM | POA: Diagnosis not present

## 2014-10-26 DIAGNOSIS — I10 Essential (primary) hypertension: Secondary | ICD-10-CM | POA: Insufficient documentation

## 2014-10-26 MED ORDER — LISINOPRIL 20 MG PO TABS: 20.0000 mg | ORAL_TABLET | Freq: Every day | ORAL | Status: DC

## 2014-10-26 MED ORDER — SITAGLIPTIN PHOSPHATE 50 MG PO TABS: 50.0000 mg | ORAL_TABLET | Freq: Every day | ORAL | Status: DC

## 2014-10-26 NOTE — Patient Instructions (Addendum)
Discontinue Metformin and HCTZ.  To get flu shot at work Doctor, hospital).

## 2014-10-26 NOTE — Progress Notes (Signed)
Name: Angela Griffith   MRN: [id number]    DOB: [birth year]   Date:10/26/2014       Progress Note  Subjective  Chief Complaint  Chief Complaint  Patient presents with  . Hospitalization Follow-up    pt was in ER 10 /04 with throbbing pressure and tightness and they diagnosed a side effect from Metformin since she was stated with 1000 mg and ER suggest to take half of it and take B/P pill     HPI Here for f/u after ER visit for nausea and abd. Fullness from Metformin.  She had dropped dose from 1000 mg/d down to 500 mg./ day 3 weeks ago.  Then she stopped it altogether 3 days ago because of continued nausea and stomach discomfort.  She was also started on HCTZ 3 days ago.  Took 1 todayh.  Has concerns about that also.    No problem-specific assessment & plan notes found for this encounter.   Past Medical History  Diagnosis Date  . GERD (gastroesophageal reflux disease)   . Colon polyps     last colonoscopy in 2015 nml  . Menstrual migraine   . Diabetes mellitus without complication [facility])     Social History  Substance Use Topics  . Smoking status: Former Smoker -- 0.25 packs/day for 34 years    Types: Cigarettes    Quit date: 01/19/2014  . Smokeless tobacco: Former Systems developer  . Alcohol Use: No     Current outpatient prescriptions:  .  hydrochlorothiazide (HYDRODIURIL) 25 MG tablet, Take 1 tablet (25 mg total) by mouth daily., Disp: 30 tablet, Rfl: 0 .  metFORMIN (GLUCOPHAGE) 500 MG tablet, Take 1 tablet (500 mg total) by mouth 2 (two) times daily with a meal., Disp: 60 tablet, Rfl: 11 .  pantoprazole (PROTONIX) 40 MG tablet, Take 1 tablet (40 mg total) by mouth daily., Disp: 30 tablet, Rfl: 11  Allergies  Allergen Reactions  . Sulfa Antibiotics Hives  . Theophyllines Hives  . Codeine Nausea Only  . Demerol [Meperidine] Nausea And Vomiting    Review of Systems  Constitutional: Negative for fever, chills, weight loss and malaise/fatigue.  HENT: Negative for hearing loss.    Eyes: Negative for blurred vision and double vision.  Respiratory: Negative for cough, shortness of breath and wheezing.   Cardiovascular: Negative for chest pain, palpitations and leg swelling.  Gastrointestinal: Negative for heartburn, nausea, vomiting, abdominal pain, diarrhea, constipation and blood in stool.  Genitourinary: Negative for dysuria, urgency and frequency.  Skin: Negative for rash.  Neurological: Negative for weakness and headaches.      Objective  Filed Vitals:   10/26/14 1123  BP: 141/97  Pulse: 72  Temp: 98.4 F (36.9 C)  TempSrc: Oral  Resp: 16  Height: 5' 6"  (1.676 m)  Weight: 203 lb 9.6 oz (92.352 kg)     Physical Exam  Constitutional: She is oriented to person, place, and time and well-developed, well-nourished, and in no distress. No distress.  HENT:  Head: Normocephalic and atraumatic.  Eyes: Conjunctivae and EOM are normal. Pupils are equal, round, and reactive to light. No scleral icterus.  Neck: Normal range of motion. Carotid bruit is not present. No thyromegaly present.  Cardiovascular: Normal rate, regular rhythm, normal heart sounds and intact distal pulses.  Exam reveals no gallop and no friction rub.   No murmur heard. Pulmonary/Chest: Effort normal and breath sounds normal. No respiratory distress. She has no wheezes. She has no rales.  Abdominal: Bowel sounds are normal.  She exhibits no distension, no abdominal bruit and no mass. There is no tenderness.  Musculoskeletal: She exhibits no edema.  Lymphadenopathy:    She has no cervical adenopathy.  Neurological: She is alert and oriented to person, place, and time.  Vitals reviewed.     Recent Results (from the past 2160 hour(s))  Comprehensive metabolic panel     Status: Abnormal   Collection Time: 09/07/14 10:05 AM  Result Value Ref Range   Sodium 139 135 - 145 mmol/L   Potassium 4.0 3.5 - 5.1 mmol/L   Chloride 106 101 - 111 mmol/L   CO2 27 22 - 32 mmol/L   Glucose, Bld 123  (H) 65 - 99 mg/dL   BUN 11 6 - 20 mg/dL   Creatinine, Ser 0.77 0.44 - 1.00 mg/dL   Calcium 9.0 8.9 - 10.3 mg/dL   Total Protein 7.4 6.5 - 8.1 g/dL   Albumin 3.9 3.5 - 5.0 g/dL   AST 24 15 - 41 U/L   ALT 17 14 - 54 U/L   Alkaline Phosphatase 79 38 - 126 U/L   Total Bilirubin 0.4 0.3 - 1.2 mg/dL   GFR calc non Af Amer >60 >60 mL/min   GFR calc Af Amer >60 >60 mL/min    Comment: (NOTE) The eGFR has been calculated using the CKD EPI equation. This calculation has not been validated in all clinical situations. eGFR's persistently <60 mL/min signify possible Chronic Kidney Disease.    Anion gap 6 5 - 15  Lipid panel     Status: Abnormal   Collection Time: 09/07/14 10:05 AM  Result Value Ref Range   Cholesterol 215 (H) 0 - 200 mg/dL   Triglycerides 55 <150 mg/dL   HDL 56 >40 mg/dL   Total CHOL/HDL Ratio 3.8 RATIO   VLDL 11 0 - 40 mg/dL   LDL Cholesterol 148 (H) 0 - 99 mg/dL    Comment:        Total Cholesterol/HDL:CHD Risk Coronary Heart Disease Risk Table                     Men   Women  1/2 Average Risk   3.4   3.3  Average Risk       5.0   4.4  2 X Average Risk   9.6   7.1  3 X Average Risk  23.4   11.0        Use the calculated Patient Ratio above and the CHD Risk Table to determine the patient's CHD Risk.        ATP III CLASSIFICATION (LDL):  <100     mg/dL   Optimal  100-129  mg/dL   Near or Above                    Optimal  130-159  mg/dL   Borderline  160-189  mg/dL   High  >190     mg/dL   Very High   Hemoglobin A1c     Status: Abnormal   Collection Time: 09/07/14 10:05 AM  Result Value Ref Range   Hgb A1c MFr Bld 7.6 (H) 4.0 - 6.0 %  CBC with Differential/Platelet     Status: Abnormal   Collection Time: 09/07/14 10:05 AM  Result Value Ref Range   WBC 10.7 3.6 - 11.0 K/uL   RBC 4.85 3.80 - 5.20 MIL/uL   Hemoglobin 12.7 12.0 - 16.0 g/dL   HCT 39.5 35.0 - 47.0 %  MCV 81.4 80.0 - 100.0 fL   MCH 26.1 26.0 - 34.0 pg   MCHC 32.1 32.0 - 36.0 g/dL   RDW 14.7  (H) 11.5 - 14.5 %   Platelets 340 150 - 440 K/uL   Neutrophils Relative % 59 %   Neutro Abs 6.4 1.4 - 6.5 K/uL   Lymphocytes Relative 32 %   Lymphs Abs 3.4 1.0 - 3.6 K/uL   Monocytes Relative 7 %   Monocytes Absolute 0.7 0.2 - 0.9 K/uL   Eosinophils Relative 1 %   Eosinophils Absolute 0.1 0 - 0.7 K/uL   Basophils Relative 1 %   Basophils Absolute 0.1 0 - 0.1 K/uL  Lipase, blood     Status: Abnormal   Collection Time: 09/07/14 10:05 AM  Result Value Ref Range   Lipase 20 (L) 22 - 51 U/L  Amylase     Status: None   Collection Time: 09/07/14 10:05 AM  Result Value Ref Range   Amylase 53 28 - 100 U/L  Microalbumin, urine     Status: Abnormal   Collection Time: 09/07/14 10:12 AM  Result Value Ref Range   Microalb, Ur 10.0 (H) Not Estab. ug/mL    Comment: (NOTE) Performed At: [facility] [place], [place] [id number] Lindon Romp MD NW:[id number]   Urinalysis complete, with microscopic [facility] only)     Status: Abnormal   Collection Time: 10/23/14  9:53 PM  Result Value Ref Range   Color, Urine COLORLESS (A) YELLOW   APPearance CLEAR (A) CLEAR   Glucose, UA NEGATIVE NEGATIVE mg/dL   Bilirubin Urine NEGATIVE NEGATIVE   Ketones, ur NEGATIVE NEGATIVE mg/dL   Specific Gravity, Urine 1.003 (L) 1.005 - 1.030   Hgb urine dipstick 1+ (A) NEGATIVE   pH 7.0 5.0 - 8.0   Protein, ur NEGATIVE NEGATIVE mg/dL   Nitrite NEGATIVE NEGATIVE   Leukocytes, UA NEGATIVE NEGATIVE   RBC / HPF 0-5 0 - 5 RBC/hpf   WBC, UA 0-5 0 - 5 WBC/hpf   Bacteria, UA RARE (A) NONE SEEN   Squamous Epithelial / LPF 0-5 (A) NONE SEEN  Hepatic function panel     Status: Abnormal   Collection Time: 10/23/14  9:53 PM  Result Value Ref Range   Total Protein 7.3 6.5 - 8.1 g/dL   Albumin 3.9 3.5 - 5.0 g/dL   AST 21 15 - 41 U/L   ALT 17 14 - 54 U/L   Alkaline Phosphatase 67 38 - 126 U/L   Total Bilirubin 0.5 0.3 - 1.2 mg/dL   Bilirubin, Direct <0.1 (L) 0.1 - 0.5 mg/dL   Indirect Bilirubin  NOT CALCULATED 0.3 - 0.9 mg/dL  Lipase, blood     Status: None   Collection Time: 10/23/14  9:53 PM  Result Value Ref Range   Lipase 26 22 - 51 U/L  Basic metabolic panel     Status: Abnormal   Collection Time: 10/23/14  9:54 PM  Result Value Ref Range   Sodium 139 135 - 145 mmol/L   Potassium 3.6 3.5 - 5.1 mmol/L   Chloride 106 101 - 111 mmol/L   CO2 25 22 - 32 mmol/L   Glucose, Bld 148 (H) 65 - 99 mg/dL   BUN 17 6 - 20 mg/dL   Creatinine, Ser 0.81 0.44 - 1.00 mg/dL   Calcium 9.1 8.9 - 10.3 mg/dL   GFR calc non Af Amer >60 >60 mL/min   GFR calc Af Amer >60 >60 mL/min  Comment: (NOTE) The eGFR has been calculated using the CKD EPI equation. This calculation has not been validated in all clinical situations. eGFR's persistently <60 mL/min signify possible Chronic Kidney Disease.    Anion gap 8 5 - 15  CBC     Status: Abnormal   Collection Time: 10/23/14  9:54 PM  Result Value Ref Range   WBC 14.5 (H) 3.6 - 11.0 K/uL   RBC 4.70 3.80 - 5.20 MIL/uL   Hemoglobin 12.2 12.0 - 16.0 g/dL   HCT 38.3 35.0 - 47.0 %   MCV 81.5 80.0 - 100.0 fL   MCH 25.9 (L) 26.0 - 34.0 pg   MCHC 31.8 (L) 32.0 - 36.0 g/dL   RDW 14.8 (H) 11.5 - 14.5 %   Platelets 357 150 - 440 K/uL  Troponin I     Status: None   Collection Time: 10/23/14  9:54 PM  Result Value Ref Range   Troponin I <0.03 <0.031 ng/mL    Comment:        NO INDICATION OF MYOCARDIAL INJURY.      Assessment & Plan  1. Type 2 diabetes mellitus without complication, without long-term current use of insulin (HCC)  - sitaGLIPtin (JANUVIA) 50 MG tablet; Take 1 tablet (50 mg total) by mouth daily.  Dispense: 30 tablet; Refill: 6  2. Essential hypertension  - lisinopril (PRINIVIL,ZESTRIL) 20 MG tablet; Take 1 tablet (20 mg total) by mouth daily.  Dispense: 90 tablet; Refill: 3  3. GERD without esophagitis

## 2014-10-29 ENCOUNTER — Telehealth: Payer: Self-pay | Admitting: Family Medicine

## 2014-10-29 ENCOUNTER — Encounter: Payer: Self-pay | Admitting: Emergency Medicine

## 2014-10-29 ENCOUNTER — Emergency Department
Admission: EM | Admit: 2014-10-29 | Discharge: 2014-10-29 | Disposition: A | Discharge status: Home / Self Care | Payer: 59 | Attending: Emergency Medicine | Admitting: Emergency Medicine

## 2014-10-29 DIAGNOSIS — R51 Headache: Secondary | ICD-10-CM | POA: Insufficient documentation

## 2014-10-29 DIAGNOSIS — E119 Type 2 diabetes mellitus without complications: Secondary | ICD-10-CM | POA: Diagnosis not present

## 2014-10-29 DIAGNOSIS — R519 Headache, unspecified: Secondary | ICD-10-CM

## 2014-10-29 DIAGNOSIS — Z87891 Personal history of nicotine dependence: Secondary | ICD-10-CM | POA: Diagnosis not present

## 2014-10-29 DIAGNOSIS — R1013 Epigastric pain: Secondary | ICD-10-CM

## 2014-10-29 DIAGNOSIS — Z79899 Other long term (current) drug therapy: Secondary | ICD-10-CM | POA: Insufficient documentation

## 2014-10-29 LAB — GLUCOSE, CAPILLARY: GLUCOSE-CAPILLARY: 101 mg/dL — AB (ref 65–99)

## 2014-10-29 MED ORDER — OMEPRAZOLE 10 MG PO CPDR: 10.0000 mg | DELAYED_RELEASE_CAPSULE | Freq: Every day | ORAL | Status: DC

## 2014-10-29 NOTE — Telephone Encounter (Signed)
Called the number that was given and as per them she went to ER. Angela Griffith

## 2014-10-29 NOTE — ED Notes (Signed)
Pt presents to ed with complaint of throbbing head pain. States she has new BP medication and new diabetes medication that may be affecting her. Dizzy spells but no syncopal episodes.0/10 pain with nausea.

## 2014-10-29 NOTE — ED Provider Notes (Signed)
[facility] Emergency Department Provider Note  ____________________________________________  Time seen: Approximately 1:03 PM  I have reviewed the triage vital signs and the nursing notes.   HISTORY  Chief Complaint Headache    HPI Angela Griffith is a 54 y.o. female who presents to emergency department for recurring headache and abdominal pain. She states that she has been evaluated for same in the emergency department approximately a week ago. She states that she was placed on 2 new medications, Januvia and lisinopril on Friday. This morning she ate a "biscuit" to take her medications with and states that she had a pulsating sensation in her stomach and a pulsating headache. She states that this is not a change from the previous symptoms but that she was concerned that there is a return of symptoms on her new medications. No nausea/vomiting, no diarrhea, no constipation. No blurred vision, no numbness or tingling, no loss of sensation or function in extremities.Patient states that its best described as a squeezing/pulsating sensation. Moderate. It's not described as painful. The symptoms are present after meals and worse with consumption of "bread products".   Past Medical History  Diagnosis Date  . GERD (gastroesophageal reflux disease)   . Colon polyps     last colonoscopy in 2015 nml  . Menstrual migraine   . Diabetes mellitus without complication [facility])     Patient Active Problem List   Diagnosis Date Noted  . HBP (high blood pressure) 10/26/2014  . GERD without esophagitis 08/31/2014  . Type 2 diabetes mellitus (HCC) 08/31/2014    Past Surgical History  Procedure Laterality Date  . Abdominal hysterectomy    . Exploratory laparotomy  1993    for endometriosis  . Cesarean section  1985 and 1988    Current Outpatient Rx  Name  Route  Sig  Dispense  Refill  . lisinopril (PRINIVIL,ZESTRIL) 20 MG tablet   Oral   Take 1 tablet (20 mg total)  by mouth daily.   90 tablet   3   . omeprazole (PRILOSEC) 10 MG capsule   Oral   Take 1 capsule (10 mg total) by mouth daily.   30 capsule   0   . sitaGLIPtin (JANUVIA) 50 MG tablet   Oral   Take 1 tablet (50 mg total) by mouth daily.   30 tablet   6     Allergies Sulfa antibiotics; Theophyllines; Codeine; and Demerol  Family History  Problem Relation Age of Onset  . Deep vein thrombosis Father   . Cancer Father     colon  . Stroke Father   . Anuerysm Maternal Grandmother   . Diabetes Mother   . Diabetes Maternal Aunt   . Diabetes Maternal Uncle     Social History Social History  Substance Use Topics  . Smoking status: Former Smoker -- 0.25 packs/day for 34 years    Types: Cigarettes    Quit date: 01/19/2014  . Smokeless tobacco: Former Neurosurgeon  . Alcohol Use: No    Review of Systems Constitutional: No fever/chills Eyes: No visual changes. ENT: No sore throat. Cardiovascular: Denies chest pain. Respiratory: Denies shortness of breath. Gastrointestinal: Endorses epigastric abdominal pain.  No nausea, no vomiting.  No diarrhea.  No constipation. Genitourinary: Negative for dysuria. Musculoskeletal: Negative for back pain. Skin: Negative for rash. Neurological: Endorses headaches but denies focal weakness or numbness.  10-point ROS otherwise negative.  ____________________________________________   PHYSICAL EXAM:  VITAL SIGNS: ED Triage Vitals  Enc Vitals Group  BP 10/29/14 1141 147/94 mmHg     Pulse Rate 10/29/14 1141 64     Resp 10/29/14 1141 18     Temp 10/29/14 1141 98.1 F (36.7 C)     Temp Source 10/29/14 1141 Oral     SpO2 10/29/14 1141 99 %     Weight 10/29/14 1141 202 lb (91.627 kg)     Height 10/29/14 1141  (1.651 m)     Head Cir --      Peak Flow --      Pain Score 10/29/14 1202 0     Pain Loc --      Pain Edu? --      Excl. in GC? --     Constitutional: Alert and oriented. Well appearing and in no acute distress. Eyes:  Conjunctivae are normal. PERRL. EOMI. Head: Atraumatic. Nose: No congestion/rhinnorhea. Mouth/Throat: Mucous membranes are moist.  Oropharynx non-erythematous. Neck: No stridor.   Cardiovascular: Normal rate, regular rhythm. Grossly normal heart sounds.  Good peripheral circulation. Respiratory: Normal respiratory effort.  No retractions. Lungs CTAB. Gastrointestinal: Soft and nontender. No distention. No abdominal bruits. No CVA tenderness. Musculoskeletal: No lower extremity tenderness nor edema.  No joint effusions. Neurologic:  Normal speech and language. No gross focal neurologic deficits are appreciated. No gait instability. Skin:  Skin is warm, dry and intact. No rash noted. Psychiatric: Mood and affect are normal. Speech and behavior are normal.  ____________________________________________   LABS (all labs ordered are listed, but only abnormal results are displayed)  Labs Reviewed  GLUCOSE, CAPILLARY - Abnormal; Notable for the following:    Glucose-Capillary 101 (*)    All other components within normal limits   ____________________________________________  EKG  EKG reveals sinus bradycardia with a rate of 52. Occasional PVC noted. No elevations or depressions noted. LVH noted. Left atrial hypertrophy. PR, QRS, QT intervals within normal limits. No Q waves or delta waves present. ____________________________________________  RADIOLOGY   ____________________________________________   PROCEDURES  Procedure(s) performed: None  Critical Care performed: No  ____________________________________________   INITIAL IMPRESSION / ASSESSMENT AND PLAN / ED COURSE  Pertinent labs & imaging results that were available during my care of the patient were reviewed by me and considered in my medical decision making (see chart for details).  I discussed the patient's history, symptoms, and exam with her. Advised patient that her laboratory and ultrasound findings from a week  ago revealed no acute abnormalities. Advised the patient that the symptoms should be further evaluated by gastroenterology. The patient verbalizes understanding of this. Advised the patient that I would change her reflux medications from protonic's to Prilosec. The patient states that she had had Prilosec in the past with no issues. Advised the patient to keep a food diary to take with her to GI. Patient verbalizes understanding and compliance with the treatment plan. ____________________________________________   FINAL CLINICAL IMPRESSION(S) / ED DIAGNOSES  Final diagnoses:  Nonintractable episodic headache, unspecified headache type  Epigastric pain      Racheal Patches, PA-C 10/29/14 1340  Jene Every, MD 10/29/14 1421

## 2014-10-29 NOTE — Telephone Encounter (Signed)
ED recommends gastroenterology evaluation. Do you want to refer to gastro? Also told patient to d/c Protonix and take Prilosec.

## 2014-10-29 NOTE — Discharge Instructions (Signed)
Abdominal Pain, Adult °Many things can cause abdominal pain. Usually, abdominal pain is not caused by a disease and will improve without treatment. It can often be observed and treated at home. Your health care provider will do a physical exam and possibly order blood tests and X-rays to help determine the seriousness of your pain. However, in many cases, more time must pass before a clear cause of the pain can be found. Before that point, your health care provider may not know if you need more testing or further treatment. °HOME CARE INSTRUCTIONS °Monitor your abdominal pain for any changes. The following actions may help to alleviate any discomfort you are experiencing: °· Only take over-the-counter or prescription medicines as directed by your health care provider. °· Do not take laxatives unless directed to do so by your health care provider. °· Try a clear liquid diet (broth, tea, or water) as directed by your health care provider. Slowly move to a bland diet as tolerated. °SEEK MEDICAL CARE IF: °· You have unexplained abdominal pain. °· You have abdominal pain associated with nausea or diarrhea. °· You have pain when you urinate or have a bowel movement. °· You experience abdominal pain that wakes you in the night. °· You have abdominal pain that is worsened or improved by eating food. °· You have abdominal pain that is worsened with eating fatty foods. °· You have a fever. °SEEK IMMEDIATE MEDICAL CARE IF: °· Your pain does not go away within 2 hours. °· You keep throwing up (vomiting). °· Your pain is felt only in portions of the abdomen, such as the right side or the left lower portion of the abdomen. °· You pass bloody or black tarry stools. °MAKE SURE YOU: °· Understand these instructions. °· Will watch your condition. °· Will get help right away if you are not doing well or get worse. °  °This information is not intended to replace advice given to you by your health care provider. Make sure you discuss  any questions you have with your health care provider. °  °Document Released: 10/15/2004 Document Revised: 09/26/2014 Document Reviewed: 09/14/2012 °Elsevier Interactive Patient Education ©2016 [employer] ° °Gastroesophageal Reflux Disease, Adult °Normally, food travels down the esophagus and stays in the stomach to be digested. However, when a person has gastroesophageal reflux disease (GERD), food and stomach acid move back up into the esophagus. When this happens, the esophagus becomes sore and inflamed. Over time, GERD can create small holes (ulcers) in the lining of the esophagus.  °CAUSES °This condition is caused by a problem with the muscle between the esophagus and the stomach (lower esophageal sphincter, or LES). Normally, the LES muscle closes after food passes through the esophagus to the stomach. When the LES is weakened or abnormal, it does not close properly, and that allows food and stomach acid to go back up into the esophagus. The LES can be weakened by certain dietary substances, medicines, and medical conditions, including: °· Tobacco use. °· Pregnancy. °· Having a hiatal hernia. °· Heavy alcohol use. °· Certain foods and beverages, such as coffee, chocolate, onions, and peppermint. °RISK FACTORS °This condition is more likely to develop in: °· People who have an increased body weight. °· People who have connective tissue disorders. °· People who use NSAID medicines. °SYMPTOMS °Symptoms of this condition include: °· Heartburn. °· Difficult or painful swallowing. °· The feeling of having a lump in the throat. °· A bitter taste in the mouth. °· Bad breath. °· Having   a large amount of saliva. °· Having an upset or bloated stomach. °· Belching. °· Chest pain. °· Shortness of breath or wheezing. °· Ongoing (chronic) cough or a night-time cough. °· Wearing away of tooth enamel. °· Weight loss. °Different conditions can cause chest pain. Make sure to see your health care provider if you experience  chest pain. °DIAGNOSIS °Your health care provider will take a medical history and perform a physical exam. To determine if you have mild or severe GERD, your health care provider may also monitor how you respond to treatment. You may also have other tests, including: °· An endoscopy to examine your stomach and esophagus with a small camera. °· A test that measures the acidity level in your esophagus. °· A test that measures how much pressure is on your esophagus. °· A barium swallow or modified barium swallow to show the shape, size, and functioning of your esophagus. °TREATMENT °The goal of treatment is to help relieve your symptoms and to prevent complications. Treatment for this condition may vary depending on how severe your symptoms are. Your health care provider may recommend: °· Changes to your diet. °· Medicine. °· Surgery. °HOME CARE INSTRUCTIONS °Diet °· Follow a diet as recommended by your health care provider. This may involve avoiding foods and drinks such as: °¨ Coffee and tea (with or without caffeine). °¨ Drinks that contain alcohol. °¨ Energy drinks and sports drinks. °¨ Carbonated drinks or sodas. °¨ Chocolate and cocoa. °¨ Peppermint and mint flavorings. °¨ Garlic and onions. °¨ Horseradish. °¨ Spicy and acidic foods, including peppers, chili powder, curry powder, vinegar, hot sauces, and barbecue sauce. °¨ Citrus fruit juices and citrus fruits, such as oranges, lemons, and limes. °¨ Tomato-based foods, such as red sauce, chili, salsa, and pizza with red sauce. °¨ Fried and fatty foods, such as donuts, french fries, potato chips, and high-fat dressings. °¨ High-fat meats, such as hot dogs and fatty cuts of red and white meats, such as rib eye steak, sausage, ham, and bacon. °¨ High-fat dairy items, such as whole milk, butter, and cream cheese. °· Eat small, frequent meals instead of large meals. °· Avoid drinking large amounts of liquid with your meals. °· Avoid eating meals during the 2-3 hours  before bedtime. °· Avoid lying down right after you eat. °· Do not exercise right after you eat. ° General Instructions  °· Pay attention to any changes in your symptoms. °· Take over-the-counter and prescription medicines only as told by your health care provider. Do not take aspirin, ibuprofen, or other NSAIDs unless your health care provider told you to do so. °· Do not use any tobacco products, including cigarettes, chewing tobacco, and e-cigarettes. If you need help quitting, ask your health care provider. °· Wear loose-fitting clothing. Do not wear anything tight around your waist that causes pressure on your abdomen. °· Raise (elevate) the head of your bed 6 inches (15cm). °· Try to reduce your stress, such as with yoga or meditation. If you need help reducing stress, ask your health care provider. °· If you are overweight, reduce your weight to an amount that is healthy for you. Ask your health care provider for guidance about a safe weight loss goal. °· Keep all follow-up visits as told by your health care provider. This is important. °SEEK MEDICAL CARE IF: °· You have new symptoms. °· You have unexplained weight loss. °· You have difficulty swallowing, or it hurts to swallow. °· You have wheezing or a persistent cough. °· Your symptoms do not   improve with treatment. °· You have a hoarse voice. °SEEK IMMEDIATE MEDICAL CARE IF: °· You have pain in your arms, neck, jaw, teeth, or back. °· You feel sweaty, dizzy, or light-headed. °· You have chest pain or shortness of breath. °· You vomit and your vomit looks like blood or coffee grounds. °· You faint. °· Your stool is bloody or black. °· You cannot swallow, drink, or eat. °  °This information is not intended to replace advice given to you by your health care provider. Make sure you discuss any questions you have with your health care provider. °  °Document Released: 10/15/2004 Document Revised: 09/26/2014 Document Reviewed: 05/02/2014 °Elsevier Interactive  Patient Education ©2016 [employer] ° °

## 2014-10-29 NOTE — Telephone Encounter (Signed)
Pt called states that Dr. Juanetta Gosling  Put her on  Pantoprazole  40 mg  Pt  also take Januvia  50 mg  (lunch) , pantoprazole, she states she was sick on her stomach  BP sitting 175/87, standing  134/99 pt call back # is  [phone].

## 2014-10-29 NOTE — Telephone Encounter (Signed)
We'll wait to see what ER tells her.-jh

## 2014-10-29 NOTE — Telephone Encounter (Signed)
I need to see ER note first.  Hopefully it will be available tomorrow.   Check Amy's box tomorrow and transfer to me if it is there.

## 2014-10-31 ENCOUNTER — Telehealth: Payer: Self-pay | Admitting: Gastroenterology

## 2014-10-31 ENCOUNTER — Other Ambulatory Visit: Payer: Self-pay | Admitting: Family Medicine

## 2014-10-31 DIAGNOSIS — K2970 Gastritis, unspecified, without bleeding: Secondary | ICD-10-CM

## 2014-10-31 NOTE — Telephone Encounter (Signed)
A referral has been placed Angela Griffith.

## 2014-10-31 NOTE — Telephone Encounter (Signed)
Go ahead and make GI referral for persistent gastritis.-jh

## 2014-10-31 NOTE — Telephone Encounter (Signed)
i have called patient to make an appointment per referral to GI. No answer. I have left a detailed message for the patient to contact the office to make an appointment.

## 2014-10-31 NOTE — Telephone Encounter (Signed)
I forward message from Amy's box to your's let me know if you haven't receive Thanks Nisha.

## 2014-11-02 ENCOUNTER — Encounter: Payer: Self-pay | Admitting: Emergency Medicine

## 2014-11-02 ENCOUNTER — Telehealth: Payer: Self-pay

## 2014-11-02 ENCOUNTER — Emergency Department
Admission: EM | Admit: 2014-11-02 | Discharge: 2014-11-02 | Disposition: A | Discharge status: Home / Self Care | Payer: 59 | Attending: Emergency Medicine | Admitting: Emergency Medicine

## 2014-11-02 DIAGNOSIS — E162 Hypoglycemia, unspecified: Secondary | ICD-10-CM

## 2014-11-02 DIAGNOSIS — E11649 Type 2 diabetes mellitus with hypoglycemia without coma: Secondary | ICD-10-CM | POA: Insufficient documentation

## 2014-11-02 DIAGNOSIS — Z79899 Other long term (current) drug therapy: Secondary | ICD-10-CM | POA: Diagnosis not present

## 2014-11-02 DIAGNOSIS — R251 Tremor, unspecified: Secondary | ICD-10-CM | POA: Insufficient documentation

## 2014-11-02 DIAGNOSIS — R531 Weakness: Secondary | ICD-10-CM | POA: Diagnosis present

## 2014-11-02 DIAGNOSIS — I10 Essential (primary) hypertension: Secondary | ICD-10-CM | POA: Insufficient documentation

## 2014-11-02 DIAGNOSIS — Z87891 Personal history of nicotine dependence: Secondary | ICD-10-CM | POA: Diagnosis not present

## 2014-11-02 LAB — CBC WITH DIFFERENTIAL/PLATELET
BASOS PCT: 1 %
Basophils Absolute: 0.1 10*3/uL (ref 0–0.1)
EOS ABS: 0.1 10*3/uL (ref 0–0.7)
Eosinophils Relative: 1 %
HCT: 36.8 % (ref 35.0–47.0)
HEMOGLOBIN: 12.1 g/dL (ref 12.0–16.0)
Lymphocytes Relative: 29 %
Lymphs Abs: 3.5 10*3/uL (ref 1.0–3.6)
MCH: 26.7 pg (ref 26.0–34.0)
MCHC: 32.8 g/dL (ref 32.0–36.0)
MCV: 81.3 fL (ref 80.0–100.0)
MONOS PCT: 9 %
Monocytes Absolute: 1 10*3/uL — ABNORMAL HIGH (ref 0.2–0.9)
Neutro Abs: 7.1 10*3/uL — ABNORMAL HIGH (ref 1.4–6.5)
Neutrophils Relative %: 60 %
PLATELETS: 328 10*3/uL (ref 150–440)
RBC: 4.52 MIL/uL (ref 3.80–5.20)
RDW: 14.3 % (ref 11.5–14.5)
WBC: 11.8 10*3/uL — AB (ref 3.6–11.0)

## 2014-11-02 LAB — LIPASE, BLOOD: LIPASE: 21 U/L — AB (ref 22–51)

## 2014-11-02 LAB — COMPREHENSIVE METABOLIC PANEL
ALBUMIN: 4 g/dL (ref 3.5–5.0)
ALT: 16 U/L (ref 14–54)
AST: 16 U/L (ref 15–41)
Alkaline Phosphatase: 62 U/L (ref 38–126)
Anion gap: 8 (ref 5–15)
BILIRUBIN TOTAL: 0.3 mg/dL (ref 0.3–1.2)
BUN: 16 mg/dL (ref 6–20)
CHLORIDE: 104 mmol/L (ref 101–111)
CO2: 27 mmol/L (ref 22–32)
CREATININE: 0.72 mg/dL (ref 0.44–1.00)
Calcium: 9.6 mg/dL (ref 8.9–10.3)
GFR calc Af Amer: >60 mL/min (ref >60)
GLUCOSE: 105 mg/dL — AB (ref 65–99)
POTASSIUM: 3.7 mmol/L (ref 3.5–5.1)
Sodium: 139 mmol/L (ref 135–145)
TOTAL PROTEIN: 7.3 g/dL (ref 6.5–8.1)

## 2014-11-02 LAB — GLUCOSE, CAPILLARY: GLUCOSE-CAPILLARY: 79 mg/dL (ref 65–99)

## 2014-11-02 LAB — TROPONIN I

## 2014-11-02 MED ORDER — AMLODIPINE BESYLATE 5 MG PO TABS
2.5000 mg | ORAL_TABLET | Freq: Once | ORAL | Status: AC
  Administered 2014-11-02: 2.5 mg via ORAL
  Filled 2014-11-02: qty 1

## 2014-11-02 MED ORDER — LORAZEPAM 2 MG/ML IJ SOLN
1.0000 mg | Freq: Once | INTRAMUSCULAR | Status: AC
  Administered 2014-11-02: 1 mg via INTRAVENOUS
  Filled 2014-11-02: qty 1

## 2014-11-02 MED ORDER — AMLODIPINE BESYLATE 2.5 MG PO TABS: 2.5000 mg | ORAL_TABLET | Freq: Every day | ORAL | Status: DC

## 2014-11-02 NOTE — ED Provider Notes (Signed)
[facility] Emergency Department Provider Note  ____________________________________________  Time seen: Approximately 5:55 PM  I have reviewed the triage vital signs and the nursing notes.   HISTORY  Chief Complaint Weakness    HPI Angela Griffith is a 54 y.o. female who works in the [employer] who presents with shakiness, generalized weakness, elevated blood pressure, shortly after eating.  She reports she has been working with her PCP for several months on BP control and diabetes treatment, but has had persistent symptoms similar to today's symptoms since trying different medications.  She currently is taking Januvia and Lisinopril.  She reports that after eating some pasta for lunch, she became flushed, felt shaky, slightly sweaty, and nauseated.  Her FSBS was noted to be 79 in the ED.  She denies "any pain", focal weakness, SOB, abd pain, chest pain, dysuria, polyuria.  Past Medical History  Diagnosis Date  . GERD (gastroesophageal reflux disease)   . Colon polyps     last colonoscopy in 2015 nml  . Menstrual migraine   . Diabetes mellitus without complication [facility])     Patient Active Problem List   Diagnosis Date Noted  . HBP (high blood pressure) 10/26/2014  . GERD without esophagitis 08/31/2014  . Type 2 diabetes mellitus (HCC) 08/31/2014    Past Surgical History  Procedure Laterality Date  . Abdominal hysterectomy    . Exploratory laparotomy  1993    for endometriosis  . Cesarean section  1985 and 1988    Current Outpatient Rx  Name  Route  Sig  Dispense  Refill  . amLODipine (NORVASC) 2.5 MG tablet   Oral   Take 1 tablet (2.5 mg total) by mouth daily.   30 tablet   0   . lisinopril (PRINIVIL,ZESTRIL) 20 MG tablet   Oral   Take 1 tablet (20 mg total) by mouth daily.   90 tablet   3   . omeprazole (PRILOSEC) 10 MG capsule   Oral   Take 1 capsule (10 mg total) by mouth daily.   30 capsule   0   . sitaGLIPtin  (JANUVIA) 50 MG tablet   Oral   Take 1 tablet (50 mg total) by mouth daily.   30 tablet   6     Allergies Sulfa antibiotics; Theophyllines; Codeine; and Demerol  Family History  Problem Relation Age of Onset  . Deep vein thrombosis Father   . Cancer Father     colon  . Stroke Father   . Anuerysm Maternal Grandmother   . Diabetes Mother   . Diabetes Maternal Aunt   . Diabetes Maternal Uncle     Social History Social History  Substance Use Topics  . Smoking status: Former Smoker -- 0.25 packs/day for 34 years    Types: Cigarettes    Quit date: 01/19/2014  . Smokeless tobacco: Former Neurosurgeon  . Alcohol Use: No    Review of Systems Constitutional: No fever/chills Eyes: No visual changes. ENT: No sore throat. Cardiovascular: Denies chest pain. Respiratory: Denies shortness of breath. Gastrointestinal: No abdominal pain.  Nausea, no vomiting.  No diarrhea.  No constipation. Genitourinary: Negative for dysuria. Musculoskeletal: Negative for back pain. Skin: Negative for rash. Neurological: Generalized "shakiness" and weakness, no specific deficits  10-point ROS otherwise negative.  ____________________________________________   PHYSICAL EXAM:  VITAL SIGNS: ED Triage Vitals  Enc Vitals Group     BP 11/02/14 1715 193/99 mmHg     Pulse Rate 11/02/14 1715 60  Resp 11/02/14 1715 18     Temp 11/02/14 1715 98.2 F (36.8 C)     Temp src --      SpO2 11/02/14 1715 100 %     Weight 11/02/14 1715 202 lb (91.627 kg)     Height 11/02/14 1715  (1.651 m)     Head Cir --      Peak Flow --      Pain Score --      Pain Loc --      Pain Edu? --      Excl. in GC? --     Constitutional: Alert and oriented. Well appearing and in no acute distress. Eyes: Conjunctivae are normal. PERRL. EOMI. Head: Atraumatic. Nose: No congestion/rhinnorhea. Mouth/Throat: Mucous membranes are moist.  Oropharynx non-erythematous. Neck: No stridor.   Cardiovascular: Normal rate,  regular rhythm. Grossly normal heart sounds.  Good peripheral circulation. Respiratory: Normal respiratory effort.  No retractions. Lungs CTAB. Gastrointestinal: Soft and nontender. No distention. No abdominal bruits. No CVA tenderness. Musculoskeletal: No lower extremity tenderness nor edema.  No joint effusions. Neurologic:  Normal speech and language. No gross focal neurologic deficits are appreciated.  Skin:  Skin is warm, dry and intact. No rash noted. Psychiatric: Mood and affect are normal. Speech and behavior are normal.  ____________________________________________   LABS (all labs ordered are listed, but only abnormal results are displayed)  Labs Reviewed  COMPREHENSIVE METABOLIC PANEL - Abnormal; Notable for the following:    Glucose, Bld 105 (*)    All other components within normal limits  LIPASE, BLOOD - Abnormal; Notable for the following:    Lipase 21 (*)    All other components within normal limits  CBC WITH DIFFERENTIAL/PLATELET - Abnormal; Notable for the following:    WBC 11.8 (*)    Neutro Abs 7.1 (*)    Monocytes Absolute 1.0 (*)    All other components within normal limits  GLUCOSE, CAPILLARY  TROPONIN I   ____________________________________________  EKG  ED ECG REPORT I, Giannis Corpuz, the attending physician, personally viewed and interpreted this ECG.  Date: 11/02/2014 EKG Time: 17:40 Rate: 55 Rhythm: Sinus bradycardia QRS Axis: normal Intervals: normal ST/T Wave abnormalities: Inverted T-wave in lead 3, otherwise unremarkable Conduction Disutrbances: none Narrative Interpretation: unremarkable  ____________________________________________  RADIOLOGY   No results found.  ____________________________________________   PROCEDURES  Procedure(s) performed: None  Critical Care performed: No ____________________________________________   INITIAL IMPRESSION / ASSESSMENT AND PLAN / ED COURSE  Pertinent labs & imaging results that  were available during my care of the patient were reviewed by me and considered in my medical decision making (see chart for details).  The patient's symptoms and history of present illness are most consistent with a transient hypoglycemia which is supported by her fingerstick blood sugar of 79.  I discussed this with the patient as well as her ongoing issues with outpatient management.  We agreed that we would check some basic labs and she would eat some graham crackers, peanut butter, and ginger ale.  If she is feeling better and her chemistry is unremarkable, I anticipate discharge home with outpatient follow-up.  There is nothing to suggest at this time that she has an emergent medical condition.  She agrees with this plan.  ----------------------------------------- 8:12 PM on 11/02/2014 -----------------------------------------  The patient was able to tolerate a by mouth challenge.  Her blood pressure is increasingly high, but her anxiety is going up increasingly as well.  It is difficult to say  if she has increasing hypertensive because of her anxiety or vice versa.  I gave her the reassuring results of her chemistry which shows no evidence of acute renal dysfunction, which is typically the first thing that shows up with a hypertensive emergency.  The patient is extremely anxious and asking someone to stay in the room with her because she is worried.  We discussed treatment of the blood pressure but I suggested we start with treatment of her anxiety and see if the blood pressure improves.  She likes this plan.  We will start with Ativan 1 mg IV and reassess.  ----------------------------------------- 9:52 PM on 11/02/2014 -----------------------------------------  She said an extensive conversation with the patient and her pastor.  She is feeling much better but her blood pressure remains high, with a diastolic pressure in the 110 range.  After extensive discussion, we decided to start her on  a low-dose of amlodipine which she will take in addition to her lisinopril.  She will follow up with her primary care doctor, Dr. Yates DecampJohn Walker, at the next available opportunity to discuss her symptoms and her medication regimen.  I gave her my usual and customary return precautions, and she understands and agrees with the plan.  At no point has she any chest pain or shortness of breath and I do not believe that her blood pressure today represents a hypertensive urgency or emergency as there is no evidence of end organ dysfunction and she is asymptomatic other than her persistent anxiety, largely as a result of the number she is seeing on the monitor.  ____________________________________________  FINAL CLINICAL IMPRESSION(S) / ED DIAGNOSES  Final diagnoses:  Essential hypertension  Hypoglycemia      NEW MEDICATIONS STARTED DURING THIS VISIT:  New Prescriptions   AMLODIPINE (NORVASC) 2.5 MG TABLET    Take 1 tablet (2.5 mg total) by mouth daily.     Loleta Roseory Wacey Zieger, MD 11/02/14 2223

## 2014-11-02 NOTE — Discharge Instructions (Signed)
As we discussed, we think her symptoms today were because her blood sugar was too low.  However, at you have had persistent high blood pressure in the emergency department as well.  Although your labs were reassuring and you have no evidence of end organ dysfunction, after we talked about it extensively we decided to add a low-dose of amlodipine to your current medication regimen.  Please take the amlodipine 2.5 mg daily along with your lisinopril.  Continue to take all of your regular medications, but if you feel symptomatic again like you were today (shaky, sweaty, flushed, etc.),  Please try to eat a snack and see if this helps.  Follow-up with your regular doctor at the next available opportunity to discuss your symptoms and your medication regimen.  Return immediately to the emergency department if you develop new or worsening symptoms that concern you.   Hypertension Hypertension, commonly called high blood pressure, is when the force of blood pumping through your arteries is too strong. Your arteries are the blood vessels that carry blood from your heart throughout your body. A blood pressure reading consists of a higher number over a lower number, such as 110/72. The higher number (systolic) is the pressure inside your arteries when your heart pumps. The lower number (diastolic) is the pressure inside your arteries when your heart relaxes. Ideally you want your blood pressure below 120/80. Hypertension forces your heart to work harder to pump blood. Your arteries may become narrow or stiff. Having untreated or uncontrolled hypertension can cause heart attack, stroke, kidney disease, and other problems. RISK FACTORS Some risk factors for high blood pressure are controllable. Others are not.  Risk factors you cannot control include:   Race. You may be at higher risk if you are African American.  Age. Risk increases with age.  Gender. Men are at higher risk than women before age 82 years.  After age 15, women are at higher risk than men. Risk factors you can control include:  Not getting enough exercise or physical activity.  Being overweight.  Getting too much fat, sugar, calories, or salt in your diet.  Drinking too much alcohol. SIGNS AND SYMPTOMS Hypertension does not usually cause signs or symptoms. Extremely high blood pressure (hypertensive crisis) may cause headache, anxiety, shortness of breath, and nosebleed. DIAGNOSIS To check if you have hypertension, your health care provider will measure your blood pressure while you are seated, with your arm held at the level of your heart. It should be measured at least twice using the same arm. Certain conditions can cause a difference in blood pressure between your right and left arms. A blood pressure reading that is higher than normal on one occasion does not mean that you need treatment. If it is not clear whether you have high blood pressure, you may be asked to return on a different day to have your blood pressure checked again. Or, you may be asked to monitor your blood pressure at home for 1 or more weeks. TREATMENT Treating high blood pressure includes making lifestyle changes and possibly taking medicine. Living a healthy lifestyle can help lower high blood pressure. You may need to change some of your habits. Lifestyle changes may include:  Following the DASH diet. This diet is high in fruits, vegetables, and whole grains. It is low in salt, red meat, and added sugars.  Keep your sodium intake below 2,300 mg per day.  Getting at least 30-45 minutes of aerobic exercise at least 4 times per  week.  Losing weight if necessary.  Not smoking.  Limiting alcoholic beverages.  Learning ways to reduce stress. Your health care provider may prescribe medicine if lifestyle changes are not enough to get your blood pressure under control, and if one of the following is true:  You are 35-99 years of age and your systolic  blood pressure is above 140.  You are 68 years of age or older, and your systolic blood pressure is above 150.  Your diastolic blood pressure is above 90.  You have diabetes, and your systolic blood pressure is over 140 or your diastolic blood pressure is over 90.  You have kidney disease and your blood pressure is above 140/90.  You have heart disease and your blood pressure is above 140/90. Your personal target blood pressure may vary depending on your medical conditions, your age, and other factors. HOME CARE INSTRUCTIONS  Have your blood pressure rechecked as directed by your health care provider.   Take medicines only as directed by your health care provider. Follow the directions carefully. Blood pressure medicines must be taken as prescribed. The medicine does not work as well when you skip doses. Skipping doses also puts you at risk for problems.  Do not smoke.   Monitor your blood pressure at home as directed by your health care provider. SEEK MEDICAL CARE IF:   You think you are having a reaction to medicines taken.  You have recurrent headaches or feel dizzy.  You have swelling in your ankles.  You have trouble with your vision. SEEK IMMEDIATE MEDICAL CARE IF:  You develop a severe headache or confusion.  You have unusual weakness, numbness, or feel faint.  You have severe chest or abdominal pain.  You vomit repeatedly.  You have trouble breathing. MAKE SURE YOU:   Understand these instructions.  Will watch your condition.  Will get help right away if you are not doing well or get worse.   This information is not intended to replace advice given to you by your health care provider. Make sure you discuss any questions you have with your health care provider.   Document Released: 01/05/2005 Document Revised: 05/22/2014 Document Reviewed: 10/28/2012 Elsevier Interactive Patient Education 2016 Else[employer] Inc.  Hypoglycemia Hypoglycemia occurs when the  glucose in your blood is too low. Glucose is a type of sugar that is your body's main energy source. Hormones, such as insulin and glucagon, control the level of glucose in the blood. Insulin lowers blood glucose and glucagon increases blood glucose. Having too much insulin in your blood stream, or not eating enough food containing sugar, can result in hypoglycemia. Hypoglycemia can happen to people with or without diabetes. It can develop quickly and can be a medical emergency.  CAUSES   Missing or delaying meals.  Not eating enough carbohydrates at meals.  Taking too much diabetes medicine.  Not timing your oral diabetes medicine or insulin doses with meals, snacks, and exercise.  Nausea and vomiting.  Certain medicines.  Severe illnesses, such as hepatitis, kidney disorders, and certain eating disorders.  Increased activity or exercise without eating something extra or adjusting medicines.  Drinking too much alcohol.  A nerve disorder that affects body functions like your heart rate, blood pressure, and digestion (autonomic neuropathy).  A condition where the stomach muscles do not function properly (gastroparesis). Therefore, medicines and food may not absorb properly.  Rarely, a tumor of the pancreas can produce too much insulin. SYMPTOMS   Hunger.  Sweating (diaphoresis).  Change in body temperature.  Shakiness.  Headache.  Anxiety.  Lightheadedness.  Irritability.  Difficulty concentrating.  Dry mouth.  Tingling or numbness in the hands or feet.  Restless sleep or sleep disturbances.  Altered speech and coordination.  Change in mental status.  Seizures or prolonged convulsions.  Combativeness.  Drowsiness (lethargic).  Weakness.  Increased heart rate or palpitations.  Confusion.  Pale, gray skin color.  Blurred or double vision.  Fainting. DIAGNOSIS  A physical exam and medical history will be performed. Your caregiver may make a  diagnosis based on your symptoms. Blood tests and other lab tests may be performed to confirm a diagnosis. Once the diagnosis is made, your caregiver will see if your signs and symptoms go away once your blood glucose is raised.  TREATMENT  Usually, you can easily treat your hypoglycemia when you notice symptoms.  Check your blood glucose. If it is less than 70 mg/dl, take one of the following:   3-4 glucose tablets.    cup juice.    cup regular soda.   1 cup skim milk.   -1 tube of glucose gel.   5-6 hard candies.   Avoid high-fat drinks or food that may delay a rise in blood glucose levels.  Do not take more than the recommended amount of sugary foods, drinks, gel, or tablets. Doing so will cause your blood glucose to go too high.   Wait 10-15 minutes and recheck your blood glucose. If it is still less than 70 mg/dl or below your target range, repeat treatment.   Eat a snack if it is more than 1 hour until your next meal.  There may be a time when your blood glucose may go so low that you are unable to treat yourself at home when you start to notice symptoms. You may need someone to help you. You may even faint or be unable to swallow. If you cannot treat yourself, someone will need to bring you to the hospital.  HOME CARE INSTRUCTIONS  If you have diabetes, follow your diabetes management plan by:  Taking your medicines as directed.  Following your exercise plan.  Following your meal plan. Do not skip meals. Eat on time.  Testing your blood glucose regularly. Check your blood glucose before and after exercise. If you exercise longer or different than usual, be sure to check blood glucose more frequently.  Wearing your medical alert jewelry that says you have diabetes.  Identify the cause of your hypoglycemia. Then, develop ways to prevent the recurrence of hypoglycemia.  Do not take a hot bath or shower right after an insulin shot.  Always carry treatment  with you. Glucose tablets are the easiest to carry.  If you are going to drink alcohol, drink it only with meals.  Tell friends or family members ways to keep you safe during a seizure. This may include removing hard or sharp objects from the area or turning you on your side.  Maintain a healthy weight. SEEK MEDICAL CARE IF:   You are having problems keeping your blood glucose in your target range.  You are having frequent episodes of hypoglycemia.  You feel you might be having side effects from your medicines.  You are not sure why your blood glucose is dropping so low.  You notice a change in vision or a new problem with your vision. SEEK IMMEDIATE MEDICAL CARE IF:   Confusion develops.  A change in mental status occurs.  The inability to swallow  develops.  Fainting occurs.   This information is not intended to replace advice given to you by your health care provider. Make sure you discuss any questions you have with your health care provider.   Document Released: 01/05/2005 Document Revised: 01/10/2013 Document Reviewed: 09/11/2014 Elsevier Interactive Patient Education [employer] Elsevier Inc.

## 2014-11-02 NOTE — Telephone Encounter (Signed)
Patient called stating that she needed to be seen for nausea and not eating well. She also stated that she feels "sick" all the time. She wanted to know if she could be seen before October 21 st. I told her that we will contact her to schedule an appointment but not sure if we could see her by October 21 st.

## 2014-11-02 NOTE — ED Notes (Signed)
[facility]  Became shaky and weak. And the staff noticed that her blood pressure was elevated

## 2014-11-05 ENCOUNTER — Telehealth: Payer: Self-pay | Admitting: Family Medicine

## 2014-11-05 NOTE — Telephone Encounter (Signed)
Pt called had question about her meds tx call to [facility].

## 2014-11-05 NOTE — Telephone Encounter (Signed)
Called pt and scheduled an appt for Wed, Oct 19th with Dr. Servando SnareWohl.

## 2014-11-05 NOTE — Telephone Encounter (Signed)
Agree-jh 

## 2014-11-05 NOTE — Telephone Encounter (Signed)
Spoke with the pt she was taking Amlodipine 2.5 mg and lisinopril together in morning after breakfast and 45 min later she felt light headache and dizzyness and check her B/P was 200/100 and wants to know if she need to take both at same time and advised her as per Dr. Juanetta GoslingHawkins that if she want she can split med's one in morning and one in afternoon or evening and she just got Rx by ED this weekend so any med's takes time to be effective in your system so give a time to be effective and also advised pt not to check B/P too often because that give stress anxiety and it can go up just check once or twice or if she feels really sick. Pt understood well Angela Griffith.

## 2014-11-07 ENCOUNTER — Encounter: Payer: Self-pay | Admitting: Gastroenterology

## 2014-11-07 ENCOUNTER — Other Ambulatory Visit: Payer: Self-pay

## 2014-11-07 ENCOUNTER — Telehealth: Payer: Self-pay | Admitting: Family Medicine

## 2014-11-07 ENCOUNTER — Ambulatory Visit (INDEPENDENT_AMBULATORY_CARE_PROVIDER_SITE_OTHER): Payer: 59 | Admitting: Gastroenterology

## 2014-11-07 VITALS — BP 163/85 | HR 89 | Temp 98.4°F | Ht 65.0 in | Wt 197.0 lb

## 2014-11-07 DIAGNOSIS — R1013 Epigastric pain: Secondary | ICD-10-CM

## 2014-11-07 DIAGNOSIS — G8929 Other chronic pain: Secondary | ICD-10-CM

## 2014-11-07 DIAGNOSIS — R112 Nausea with vomiting, unspecified: Secondary | ICD-10-CM

## 2014-11-07 DIAGNOSIS — R634 Abnormal weight loss: Secondary | ICD-10-CM

## 2014-11-07 DIAGNOSIS — R110 Nausea: Secondary | ICD-10-CM

## 2014-11-07 NOTE — Telephone Encounter (Signed)
Pt has concerns about her BP medication.  She is taking lisinopril and amlodipine and sometimes she fells like her blood pressure is dropping to fast.  She feels weak and dizzy for a few minutes.  Please call [phone]

## 2014-11-07 NOTE — Progress Notes (Signed)
Gastroenterology Consultation  Referring Provider:     Loura PardonKrebs, Amy Lauren, NP Primary Care Physician:  Filbert BertholdAmy L Krebs, NP Primary Gastroenterologist:  Dr. Servando SnareWohl     Reason for Consultation:     Nausea and epigastric pain        HPI:   Angela Griffith is a 54 y.o. y/o female referred for consultation & management of nausea and epigastric pain by Dr. Filbert BertholdAmy L Krebs, NP.  Patient comes in today reporting of nausea and epigastric pain resulting in a visit to the emergency room. The patient states that she was started on medication for her high glucose and hypertension. The patient states that shortly after starting the medication she started having epigastric discomfort. The patient states she has had epigastric discomfort in the past which is usually resolves when she takes lemon water. The patient states that she was having the pain was not getting any better. They stopped the medication for her increased glucose level but after on the blood pressure medication due to her hypertension. The patient states that she was in the emergency department and says that she continues to have the symptoms despite the put on pantoprazole. She does report that the Zantac does help her somewhat. Her pain is a burning pain in the epigastric area and she has lost approximate 10 pound last few months from this. There is no report of any black stools or bloody stools. The patient also reports that she had a colonoscopy by Dr. Mechele CollinElliott last year for history of polyps. There is no report of any vomiting up blood or dark material.  Past Medical History  Diagnosis Date  . GERD (gastroesophageal reflux disease)   . Colon polyps     last colonoscopy in 2015 nml  . Menstrual migraine   . Diabetes mellitus without complication [facility](HCC)     Past Surgical History  Procedure Laterality Date  . Abdominal hysterectomy    . Exploratory laparotomy  1993    for endometriosis  . Cesarean section  1985 and 1988    Prior to  Admission medications   Medication Sig Start Date End Date Taking? Authorizing Provider  amLODipine (NORVASC) 2.5 MG tablet Take 1 tablet (2.5 mg total) by mouth daily. 11/02/14 11/02/15 Yes Loleta Roseory Forbach, MD  lisinopril (PRINIVIL,ZESTRIL) 20 MG tablet Take 1 tablet (20 mg total) by mouth daily. 10/26/14  Yes Janeann ForehandJames H Hawkins Jr., MD  pantoprazole (PROTONIX) 40 MG tablet Take 40 mg by mouth daily.   Yes Historical Provider, MD  ranitidine (ZANTAC) 150 MG tablet Take 150 mg by mouth 2 (two) times daily.   Yes Historical Provider, MD  hydrochlorothiazide (HYDRODIURIL) 25 MG tablet  10/25/14   Historical Provider, MD  omeprazole (PRILOSEC) 10 MG capsule Take 1 capsule (10 mg total) by mouth daily. Patient not taking: Reported on 11/07/2014 10/29/14   Delorise RoyalsJonathan D Cuthriell, PA-C  sitaGLIPtin (JANUVIA) 50 MG tablet Take 1 tablet (50 mg total) by mouth daily. 10/26/14   Janeann ForehandJames H Hawkins Jr., MD    Family History  Problem Relation Age of Onset  . Deep vein thrombosis Father   . Cancer Father     colon  . Stroke Father   . Anuerysm Maternal Grandmother   . Diabetes Mother   . Diabetes Maternal Aunt   . Diabetes Maternal Uncle      Social History  Substance Use Topics  . Smoking status: Former Smoker -- 0.25 packs/day for 34 years    Types: Cigarettes  Quit date: 01/19/2014  . Smokeless tobacco: Never Used  . Alcohol Use: No    Allergies as of 11/07/2014 - Review Complete 11/07/2014  Allergen Reaction Noted  . Sulfa antibiotics Hives 08/31/2014  . Theophyllines Hives 08/31/2014  . Codeine Nausea Only 10/05/2014  . Demerol [meperidine] Nausea And Vomiting 08/31/2014    Review of Systems:    All systems reviewed and negative except where noted in HPI.   Physical Exam:  BP 163/85 mmHg  Pulse 89  Temp(Src) 98.4 F (36.9 C) (Oral)  Ht  (1.651 m)  Wt 197 lb (89.359 kg)  BMI 32.78 kg/m2 No LMP recorded. Patient has had a hysterectomy. Psych:  Alert and cooperative. Normal mood  and affect. General:   Alert,  Well-developed, well-nourished, pleasant and cooperative in NAD Head:  Normocephalic and atraumatic. Eyes:  Sclera clear, no icterus.   Conjunctiva pink. Ears:  Normal auditory acuity. Nose:  No deformity, discharge, or lesions. Mouth:  No deformity or lesions,oropharynx pink & moist. Neck:  Supple; no masses or thyromegaly. Lungs:  Respirations even and unlabored.  Clear throughout to auscultation.   No wheezes, crackles, or rhonchi. No acute distress. Heart:  Regular rate and rhythm; no murmurs, clicks, rubs, or gallops. Abdomen:  Normal bowel sounds.  No bruits.  Soft, with epigastric tenderness and non-distended without masses, hepatosplenomegaly or hernias noted.  No guarding or rebound tenderness.  Negative Carnett sign.   Rectal:  Deferred.  Msk:  Symmetrical without gross deformities.  Good, equal movement & strength bilaterally. Pulses:  Normal pulses noted. Extremities:  No clubbing or edema.  No cyanosis. Neurologic:  Alert and oriented x3;  grossly normal neurologically. Skin:  Intact without significant lesions or rashes.  No jaundice. Lymph Nodes:  No significant cervical adenopathy. Psych:  Alert and cooperative. Normal mood and affect.  Imaging Studies: Dg Chest 2 View  10/23/2014  CLINICAL DATA:  Acute onset of upper abdominal and chest throbbing. Palpitations. Initial encounter. EXAM: CHEST  2 VIEW COMPARISON:  Chest radiograph from 07/13/2006 FINDINGS: The lungs are well-aerated and clear. There is no evidence of focal opacification, pleural effusion or pneumothorax. The heart is normal in size; the mediastinal contour is within normal limits. No acute osseous abnormalities are seen. IMPRESSION: No acute cardiopulmonary process seen. Electronically Signed   By: Roanna Raider M.D.   On: 10/23/2014 22:18   US Abdomen Limited Ruq  10/24/2014  CLINICAL DATA:  Acute onset of epigastric abdominal pain. Initial encounter. EXAM: US ABDOMEN LIMITED -  RIGHT UPPER QUADRANT COMPARISON:  None. FINDINGS: Gallbladder: No gallstones or wall thickening visualized. No sonographic Murphy sign noted. Common bile duct: Diameter: 0.4 cm, within normal limits in caliber. Liver: No focal lesion identified. Within normal limits in parenchymal echogenicity. IMPRESSION: Unremarkable ultrasound of the right upper quadrant. Electronically Signed   By: Roanna Raider M.D.   On: 10/24/2014 01:29    Assessment and Plan:   Angela Griffith is a 54 y.o. y/o female who comes in today because she could not be seen earlier by Dr. Mechele Collin with a report of epigastric pain. The patient had a negative ultrasound of the right upper quadrant. She does report that fatty foods or greasy foods make her symptoms worse. Her tenderness on exam was in the epigastric area. The patient will be set up for a HIDA scan with gallbladder emptying studies. She will also be started on Dexilant and stop the pantoprazole. She will has also been set up for an upper  endoscopy to look for peptic ulcer disease. I have discussed risks & benefits which include, but are not limited to, bleeding, infection, perforation & drug reaction.  The patient agrees with this plan & written consent will be obtained.     ictation was prepared with Dragon dictation along with smaller phrase technology. Any transcriptional errors that result from this process are unintentional.

## 2014-11-07 NOTE — Telephone Encounter (Signed)
Patient has appt 10/20 @ 8:30 with Dr. Hollace HaywardPlonk.

## 2014-11-07 NOTE — Telephone Encounter (Signed)
See previous note.-jh

## 2014-11-07 NOTE — Telephone Encounter (Signed)
OK.  Keep appt. With Dr. Hollace HaywardPlonk to check BP.-jh

## 2014-11-07 NOTE — Telephone Encounter (Signed)
Angela Griffith, CMA at 11/05/2014 11:54 AM     Status: Signed       Expand All Collapse All   Spoke with the pt she was taking Amlodipine 2.5 mg and lisinopril together in morning after breakfast and 45 min later she felt light headache and dizzyness and check her B/P was 200/100 and wants to know if she need to take both at same time and advised her as per Dr. Juanetta GoslingHawkins that if she want she can split med's one in morning and one in afternoon or evening and she just got Rx by ED this weekend so any med's takes time to be effective in your system so give a time to be effective and also advised pt not to check B/P too often because that give stress anxiety and it can go up just check once or twice or if she feels really sick. Pt understood well Nisha.

## 2014-11-08 ENCOUNTER — Encounter: Payer: Self-pay | Admitting: Family Medicine

## 2014-11-08 ENCOUNTER — Telehealth: Payer: Self-pay | Admitting: Gastroenterology

## 2014-11-08 ENCOUNTER — Ambulatory Visit (INDEPENDENT_AMBULATORY_CARE_PROVIDER_SITE_OTHER): Payer: 59 | Admitting: Family Medicine

## 2014-11-08 VITALS — BP 147/95 | HR 82 | Temp 98.1°F | Resp 16 | Ht 65.0 in | Wt 195.6 lb

## 2014-11-08 DIAGNOSIS — K219 Gastro-esophageal reflux disease without esophagitis: Secondary | ICD-10-CM

## 2014-11-08 DIAGNOSIS — E119 Type 2 diabetes mellitus without complications: Secondary | ICD-10-CM | POA: Diagnosis not present

## 2014-11-08 DIAGNOSIS — I10 Essential (primary) hypertension: Secondary | ICD-10-CM | POA: Diagnosis not present

## 2014-11-08 DIAGNOSIS — E669 Obesity, unspecified: Secondary | ICD-10-CM | POA: Diagnosis not present

## 2014-11-08 NOTE — Telephone Encounter (Signed)
Spoke with pt regarding her pain. Advised she may take 2 if she needs to but not to do it very often because she will run out of medication. Pt understood and will call if this doesn't help.

## 2014-11-08 NOTE — Progress Notes (Signed)
Date:  11/08/2014   Name:  Angela Griffith   DOB:  07/02/1960   MRN:  [id number]  PCP:  Filbert Berthold, NP    Chief Complaint: Hypertension   History of Present Illness:  This is a 54 y.o. female dx'd with T2DM/HTN in August, started on metformin which caused dyspepsia and diarrhea and HCTZ, saw Dr. Juanetta Gosling 10/26/14, switched to Januvia and lisinopril. Has been to ED twice since for persistent GI issues, primarily nausea and epigastric throbbing after she eats. Also gets HA but denies weakness or shakiness, did have FSBG of 70 once in ED. BG's at home have been "normal" and she thinks she can control DM with lifestyle changes only (initial a1c 7.6%). Has seen Dr. Servando Snare yesterday who switched her to Dexilant and scheduled HIDA scan and EGD. ER started amlodipine 6 days ago, making her feel worse, wants to go back to HCTZ. Of note mother and cousin died last month and felt better with Ativan given to calm her down in ED.  Review of Systems:  Review of Systems  Constitutional: Negative for fever.  HENT: Negative for trouble swallowing.   Respiratory: Negative for shortness of breath.   Cardiovascular: Negative for chest pain and leg swelling.  Genitourinary: Negative for difficulty urinating.  Neurological: Negative for syncope and light-headedness.  Psychiatric/Behavioral: The patient is nervous/anxious.     Patient Active Problem List   Diagnosis Date Noted  . Obesity, Class I, BMI 30-34.9 11/08/2014  . HBP (high blood pressure) 10/26/2014  . GERD without esophagitis 08/31/2014  . Diabetes mellitus without complication (HCC) 08/31/2014    Prior to Admission medications   Medication Sig Start Date End Date Taking? Authorizing Provider  dexlansoprazole (DEXILANT) 60 MG capsule Take 60 mg by mouth daily.   Yes Historical Provider, MD  hydrochlorothiazide (HYDRODIURIL) 25 MG tablet  10/25/14  Yes Historical Provider, MD  lisinopril (PRINIVIL,ZESTRIL) 20 MG tablet Take 1 tablet (20 mg  total) by mouth daily. 10/26/14  Yes [name]., MD    Allergies  Allergen Reactions  . Sulfa Antibiotics Hives  . Theophyllines Hives  . Codeine Nausea Only  . Demerol [Meperidine] Nausea And Vomiting    Past Surgical History  Procedure Laterality Date  . Abdominal hysterectomy    . Exploratory laparotomy  1993    for endometriosis  . Cesarean section  1985 and 1988    Social History  Substance Use Topics  . Smoking status: Former Smoker -- 0.25 packs/day for 34 years    Types: Cigarettes    Quit date: 01/19/2014  . Smokeless tobacco: Never Used  . Alcohol Use: No    Family History  Problem Relation Age of Onset  . Deep vein thrombosis Father   . Cancer Father     colon  . Stroke Father   . Anuerysm Maternal Grandmother   . Diabetes Mother   . Diabetes Maternal Aunt   . Diabetes Maternal Uncle     Medication list has been reviewed and updated.  Physical Examination: BP 147/95 mmHg  Pulse 82  Temp(Src) 98.1 F (36.7 C) (Oral)  Resp 16  Ht  (1.651 m)  Wt 195 lb 9.6 oz (88.724 kg)  BMI 32.55 kg/m2  Physical Exam  Constitutional: She is oriented to person, place, and time. She appears well-developed and well-nourished.  Cardiovascular: Normal rate, regular rhythm and normal heart sounds.   Pulmonary/Chest: Effort normal and breath sounds normal.  Musculoskeletal: She exhibits no edema.  Neurological:  She is alert and oriented to person, place, and time. Coordination normal.  Skin: Skin is warm and dry.  Psychiatric: She has a normal mood and affect. Her behavior is normal.  Nursing note and vitals reviewed.   Assessment and Plan:  1. Essential hypertension Marginal control today but much improved from ED, suspect significant anxiety component. D/c amlodipine as making feel worse, restart HCTZ (tolerated well despite stated sulfa allergy), continue lisinopril  2. Diabetes mellitus without complication (HCC) Given marginal initial a1c,  potential se's, and desire for control with lifestyle changes, recommend stay off DM meds at least until HTN/GI issues resolved  3. GERD without esophagitis Saw GI yesterday, better on Dexilant, HIDA scan and EGD scheduled  4. Obesity, Class I, BMI 30-34.9 Exercise/weight loss discussed  Return in about 2 weeks (around 11/22/2014).  Dionne AnoWilliam M. Kingsley SpittlePlonk, Jr. MD [facility]  11/08/2014

## 2014-11-08 NOTE — Telephone Encounter (Signed)
Saw Dr. Servando SnareWohl yesterday. Taking Dexilant but started having stomach spasms again. What should she do? Please call at work before 5 today.

## 2014-11-09 ENCOUNTER — Telehealth: Payer: Self-pay | Admitting: Family Medicine

## 2014-11-09 NOTE — Telephone Encounter (Signed)
Ask her to take HCTZ in AM and Lisinopril in the PM.  Make an appt. For her with Dr. Kirke CorinArida at [facility].

## 2014-11-09 NOTE — Telephone Encounter (Signed)
Pt called states have questions about her BP , need a nurse to call back   Pt call back # is   [phone].

## 2014-11-09 NOTE — Telephone Encounter (Signed)
Pt called today saying her blood pressure this morning was 119 /76 before taking any pill around 8:30 am and she takes her medication at 10:00 AM and checked her blood pressure around 2:30 pm and it was 146/74 and Nurse suggested her to sit for minute and they did orthostatic blood pressure while sitting down was 119/74 and lying down was 107/74 and she felt funny and check her B/P around 2:30 pm. Her question is may be she is taking too much medicine for blood pressure that it's dropping I advised all this number is normal and borderline but I will give her a call with Dr. Juanetta GoslingHawkins suggestion ? Angela Griffith

## 2014-11-12 ENCOUNTER — Encounter: Payer: Self-pay | Admitting: *Deleted

## 2014-11-12 ENCOUNTER — Telehealth: Payer: Self-pay | Admitting: Gastroenterology

## 2014-11-12 ENCOUNTER — Other Ambulatory Visit: Payer: Self-pay | Admitting: Family Medicine

## 2014-11-12 DIAGNOSIS — R42 Dizziness and giddiness: Secondary | ICD-10-CM

## 2014-11-12 DIAGNOSIS — I10 Essential (primary) hypertension: Secondary | ICD-10-CM

## 2014-11-12 NOTE — Telephone Encounter (Signed)
Patient has a question about her Dexilant. She left a voice message Saturday.

## 2014-11-12 NOTE — Telephone Encounter (Signed)
Every time Angela Griffith has the gastro episodes Angela Griffith seems to have the BP issues and is now following Dr. Servando SnareWohl. Angela Griffith is thinking Angela Griffith may not need Cardiology appt but it is scheduled for Dec 27 2014 and if Angela Griffith feels different Angela Griffith can cancel at that time.

## 2014-11-12 NOTE — Telephone Encounter (Signed)
LVM for pt to return my call regarding question about Dexilant.

## 2014-11-13 ENCOUNTER — Telehealth: Payer: Self-pay

## 2014-11-13 NOTE — Telephone Encounter (Signed)
Left message for pt to return my call.

## 2014-11-13 NOTE — Telephone Encounter (Signed)
FORWARDING MESSAGE TO DR. PLONK.

## 2014-11-13 NOTE — Telephone Encounter (Signed)
Patient called and is reporting that her BP is dropping.  Today readings: Sitting 113/65 Standing 99/66.  Patient is requesting a call back from nurse.

## 2014-11-14 NOTE — Telephone Encounter (Signed)
Called patient this morning to f/u on how Angela Griffith was feeling. Angela Griffith says Angela Griffith cut Lisinopril in half. Angela Griffith took 10 mg and feels better this morning. Is Angela Griffith to stay on Lisinopril until Angela Griffith follows up in Nov?

## 2014-11-14 NOTE — Telephone Encounter (Signed)
These BP numbers are ok as long as not getting lightheaded or presyncopal. Recommend stay on HCTZ (should be off amlodipine) and keep f/u appt

## 2014-11-14 NOTE — Telephone Encounter (Signed)
Ok

## 2014-11-14 NOTE — Telephone Encounter (Signed)
Yes

## 2014-11-15 NOTE — Discharge Instructions (Signed)

## 2014-11-16 ENCOUNTER — Other Ambulatory Visit: Payer: Self-pay

## 2014-11-16 ENCOUNTER — Ambulatory Visit
Admission: RE | Admit: 2014-11-16 | Discharge: 2014-11-16 | Disposition: A | Discharge status: Home / Self Care | Payer: 59 | Source: Ambulatory Visit | Attending: Gastroenterology | Admitting: Gastroenterology

## 2014-11-16 DIAGNOSIS — R1013 Epigastric pain: Secondary | ICD-10-CM | POA: Diagnosis present

## 2014-11-16 DIAGNOSIS — G8929 Other chronic pain: Secondary | ICD-10-CM | POA: Diagnosis present

## 2014-11-16 DIAGNOSIS — K2100 Gastro-esophageal reflux disease with esophagitis, without bleeding: Secondary | ICD-10-CM

## 2014-11-16 DIAGNOSIS — R112 Nausea with vomiting, unspecified: Secondary | ICD-10-CM | POA: Diagnosis not present

## 2014-11-16 MED ORDER — DEXLANSOPRAZOLE 60 MG PO CPDR: 60.0000 mg | DELAYED_RELEASE_CAPSULE | Freq: Every day | ORAL | Status: DC

## 2014-11-16 MED ORDER — SINCALIDE 5 MCG IJ SOLR
0.0200 ug/kg | Freq: Once | INTRAMUSCULAR | Status: AC
  Administered 2014-11-16: 1.78 ug via INTRAVENOUS

## 2014-11-16 MED ORDER — TECHNETIUM TC 99M MEBROFENIN IV KIT
5.1300 | PACK | Freq: Once | INTRAVENOUS | Status: DC | PRN
  Administered 2014-11-16: 5.13 via INTRAVENOUS
  Filled 2014-11-16: qty 6

## 2014-11-19 ENCOUNTER — Encounter
Admission: RE | Disposition: A | Discharge status: Home / Self Care | Payer: Self-pay | Source: Ambulatory Visit | Attending: Gastroenterology

## 2014-11-19 ENCOUNTER — Encounter: Payer: Self-pay | Admitting: *Deleted

## 2014-11-19 ENCOUNTER — Ambulatory Visit: Payer: 59 | Admitting: Anesthesiology

## 2014-11-19 ENCOUNTER — Telehealth: Payer: Self-pay

## 2014-11-19 ENCOUNTER — Other Ambulatory Visit: Payer: Self-pay | Admitting: Gastroenterology

## 2014-11-19 ENCOUNTER — Ambulatory Visit
Admission: RE | Admit: 2014-11-19 | Discharge: 2014-11-19 | Disposition: A | Discharge status: Home / Self Care | Payer: 59 | Source: Ambulatory Visit | Attending: Gastroenterology | Admitting: Gastroenterology

## 2014-11-19 DIAGNOSIS — K219 Gastro-esophageal reflux disease without esophagitis: Secondary | ICD-10-CM | POA: Insufficient documentation

## 2014-11-19 DIAGNOSIS — Z882 Allergy status to sulfonamides status: Secondary | ICD-10-CM | POA: Diagnosis not present

## 2014-11-19 DIAGNOSIS — R112 Nausea with vomiting, unspecified: Secondary | ICD-10-CM | POA: Insufficient documentation

## 2014-11-19 DIAGNOSIS — Z86010 Personal history of colonic polyps: Secondary | ICD-10-CM | POA: Diagnosis not present

## 2014-11-19 DIAGNOSIS — Z79899 Other long term (current) drug therapy: Secondary | ICD-10-CM | POA: Insufficient documentation

## 2014-11-19 DIAGNOSIS — E119 Type 2 diabetes mellitus without complications: Secondary | ICD-10-CM | POA: Diagnosis not present

## 2014-11-19 DIAGNOSIS — Z885 Allergy status to narcotic agent status: Secondary | ICD-10-CM | POA: Insufficient documentation

## 2014-11-19 DIAGNOSIS — R1115 Cyclical vomiting syndrome unrelated to migraine: Secondary | ICD-10-CM | POA: Insufficient documentation

## 2014-11-19 DIAGNOSIS — R12 Heartburn: Secondary | ICD-10-CM | POA: Insufficient documentation

## 2014-11-19 DIAGNOSIS — Z800 Family history of malignant neoplasm of digestive organs: Secondary | ICD-10-CM | POA: Insufficient documentation

## 2014-11-19 DIAGNOSIS — K449 Diaphragmatic hernia without obstruction or gangrene: Secondary | ICD-10-CM | POA: Diagnosis not present

## 2014-11-19 DIAGNOSIS — G43909 Migraine, unspecified, not intractable, without status migrainosus: Secondary | ICD-10-CM | POA: Insufficient documentation

## 2014-11-19 DIAGNOSIS — Z90710 Acquired absence of both cervix and uterus: Secondary | ICD-10-CM | POA: Diagnosis not present

## 2014-11-19 DIAGNOSIS — Z8249 Family history of ischemic heart disease and other diseases of the circulatory system: Secondary | ICD-10-CM | POA: Diagnosis not present

## 2014-11-19 DIAGNOSIS — K2970 Gastritis, unspecified, without bleeding: Secondary | ICD-10-CM | POA: Diagnosis not present

## 2014-11-19 DIAGNOSIS — I10 Essential (primary) hypertension: Secondary | ICD-10-CM | POA: Insufficient documentation

## 2014-11-19 DIAGNOSIS — Z888 Allergy status to other drugs, medicaments and biological substances status: Secondary | ICD-10-CM | POA: Insufficient documentation

## 2014-11-19 DIAGNOSIS — G43A0 Cyclical vomiting, not intractable: Secondary | ICD-10-CM | POA: Diagnosis not present

## 2014-11-19 DIAGNOSIS — Z823 Family history of stroke: Secondary | ICD-10-CM | POA: Insufficient documentation

## 2014-11-19 DIAGNOSIS — Z833 Family history of diabetes mellitus: Secondary | ICD-10-CM | POA: Diagnosis not present

## 2014-11-19 DIAGNOSIS — R1013 Epigastric pain: Secondary | ICD-10-CM | POA: Insufficient documentation

## 2014-11-19 DIAGNOSIS — Z87891 Personal history of nicotine dependence: Secondary | ICD-10-CM | POA: Diagnosis not present

## 2014-11-19 HISTORY — DX: Essential (primary) hypertension: I10

## 2014-11-19 HISTORY — PX: ESOPHAGOGASTRODUODENOSCOPY (EGD) WITH PROPOFOL: SHX5813

## 2014-11-19 SURGERY — ESOPHAGOGASTRODUODENOSCOPY (EGD) WITH PROPOFOL
Anesthesia: Monitor Anesthesia Care | Wound class: Clean Contaminated

## 2014-11-19 MED ORDER — STERILE WATER FOR IRRIGATION IR SOLN
Status: DC | PRN
  Administered 2014-11-19: 10:00:00

## 2014-11-19 MED ORDER — LIDOCAINE HCL (CARDIAC) 20 MG/ML IV SOLN
INTRAVENOUS | Status: DC | PRN
  Administered 2014-11-19: 40 mg via INTRAVENOUS

## 2014-11-19 MED ORDER — PROPOFOL 10 MG/ML IV BOLUS
INTRAVENOUS | Status: DC | PRN
  Administered 2014-11-19: 20 mg via INTRAVENOUS
  Administered 2014-11-19: 50 mg via INTRAVENOUS
  Administered 2014-11-19: 30 mg via INTRAVENOUS
  Administered 2014-11-19: 50 mg via INTRAVENOUS

## 2014-11-19 MED ORDER — GLYCOPYRROLATE 0.2 MG/ML IJ SOLN
INTRAMUSCULAR | Status: DC | PRN
  Administered 2014-11-19: 0.2 mg via INTRAVENOUS

## 2014-11-19 MED ORDER — LACTATED RINGERS IV SOLN
INTRAVENOUS | Status: DC
  Administered 2014-11-19: 09:00:00 via INTRAVENOUS

## 2014-11-19 SURGICAL SUPPLY — 39 items

## 2014-11-19 NOTE — Telephone Encounter (Signed)
Pt notified of HIDA scan results.  

## 2014-11-19 NOTE — Anesthesia Procedure Notes (Signed)
Procedure Name: MAC Performed by: Shanelle Clontz Pre-anesthesia Checklist: Patient identified, Emergency Drugs available, Suction available, Timeout performed and Patient being monitored Patient Re-evaluated:Patient Re-evaluated prior to inductionOxygen Delivery Method: Nasal cannula Placement Confirmation: positive ETCO2     

## 2014-11-19 NOTE — Telephone Encounter (Signed)
-----   Message from Midge Miniumarren Wohl, MD sent at 11/19/2014 12:46 PM EDT ----- The patient know that her gallbladder emptying was below normal and if she continues to have problems with fatty or greasy foods she should be set up for an appointment with our surgeons.

## 2014-11-19 NOTE — Anesthesia Postprocedure Evaluation (Signed)
  Anesthesia Post-op Note  Patient: Angela Griffith  Procedure(s) Performed: Procedure(s): ESOPHAGOGASTRODUODENOSCOPY (EGD) WITH PROPOFOL (N/A)  Anesthesia type:MAC  Patient location: PACU  Post pain: Pain level controlled  Post assessment: Post-op Vital signs reviewed, Patient's Cardiovascular Status Stable, Respiratory Function Stable, Patent Airway and No signs of Nausea or vomiting  Post vital signs: Reviewed and stable  Last Vitals:  Filed Vitals:   11/19/14 1030  BP: 123/86  Pulse: 68  Temp:   Resp: 16    Level of consciousness: awake, alert  and patient cooperative  Complications: No apparent anesthesia complications

## 2014-11-19 NOTE — Transfer of Care (Signed)
Immediate Anesthesia Transfer of Care Note  Patient: Angela HamsBettye B Madariaga  Procedure(s) Performed: Procedure(s): ESOPHAGOGASTRODUODENOSCOPY (EGD) WITH PROPOFOL (N/A)  Patient Location: PACU  Anesthesia Type: MAC  Level of Consciousness: awake, alert  and patient cooperative  Airway and Oxygen Therapy: Patient Spontanous Breathing and Patient connected to supplemental oxygen  Post-op Assessment: Post-op Vital signs reviewed, Patient's Cardiovascular Status Stable, Respiratory Function Stable, Patent Airway and No signs of Nausea or vomiting  Post-op Vital Signs: Reviewed and stable  Complications: No apparent anesthesia complications

## 2014-11-19 NOTE — H&P (Signed)
[facility]  [address] [place], [place] [address] Phone: [phone] Fax : [phone]  Primary Care Physician:  Filbert BertholdAmy L Krebs, NP Primary Gastroenterologist:  Dr. Servando SnareWohl  Pre-Procedure History & Physical: HPI:  Angela HamsBettye B Griffith is a 54 y.o. female is here for an endoscopy.   Past Medical History  Diagnosis Date  . GERD (gastroesophageal reflux disease)   . Colon polyps     last colonoscopy in 2015 nml  . Menstrual migraine   . Diabetes mellitus without complication (HCC)     no meds over 1 month  . Hypertension     Past Surgical History  Procedure Laterality Date  . Abdominal hysterectomy    . Exploratory laparotomy  1993    for endometriosis  . Cesarean section  1985 and 1988    Prior to Admission medications   Medication Sig Start Date End Date Taking? Authorizing Provider  calcium carbonate (TUMS - DOSED IN MG ELEMENTAL CALCIUM) 500 MG chewable tablet Chew 1 tablet by mouth as needed for indigestion or heartburn.   Yes Historical Provider, MD  hydrochlorothiazide (HYDRODIURIL) 25 MG tablet  10/25/14  Yes Historical Provider, MD  ranitidine (ZANTAC) 150 MG tablet Take 150 mg by mouth 2 (two) times daily.   Yes Historical Provider, MD  dexlansoprazole (DEXILANT) 60 MG capsule Take 1 capsule (60 mg total) by mouth daily. 11/16/14   Midge Miniumarren Lilou Kneip, MD  lisinopril (PRINIVIL,ZESTRIL) 20 MG tablet Take 1 tablet (20 mg total) by mouth daily. Patient not taking: Reported on 11/19/2014 10/26/14   Janeann ForehandJames H Hawkins Jr., MD    Allergies as of 11/07/2014 - Review Complete 11/07/2014  Allergen Reaction Noted  . Sulfa antibiotics Hives 08/31/2014  . Theophyllines Hives 08/31/2014  . Codeine Nausea Only 10/05/2014  . Demerol [meperidine] Nausea And Vomiting 08/31/2014    Family History  Problem Relation Age of Onset  . Deep vein thrombosis Father   . Cancer Father     colon  . Stroke Father   . Anuerysm Maternal Grandmother   . Diabetes Mother   .  Diabetes Maternal Aunt   . Diabetes Maternal Uncle     Social History   Social History  . Marital Status: Single    Spouse Name: N/A  . Number of Children: N/A  . Years of Education: N/A   Occupational History  . Not on file.   Social History Main Topics  . Smoking status: Former Smoker -- 0.25 packs/day for 34 years    Types: Cigarettes    Quit date: 01/19/2014  . Smokeless tobacco: Never Used  . Alcohol Use: No  . Drug Use: No  . Sexual Activity: Not on file   Other Topics Concern  . Not on file   Social History Narrative    Review of Systems: See HPI, otherwise negative ROS  Physical Exam: Pulse 74  Temp(Src) 97.3 F (36.3 C)  Resp 16  Ht 5\' 5"  (1.651 m)  Wt 192 lb (87.091 kg)  BMI 31.95 kg/m2  SpO2 100% General:   Alert,  pleasant and cooperative in NAD Head:  Normocephalic and atraumatic. Neck:  Supple; no masses or thyromegaly. Lungs:  Clear throughout to auscultation.    Heart:  Regular rate and rhythm. Abdomen:  Soft, nontender and nondistended. Normal bowel sounds, without guarding, and without rebound.   Neurologic:  Alert and  oriented x4;  grossly normal neurologically.  Impression/Plan: Angela Griffith is here for an endoscopy to be performed for N/V and  epigastric pain.  Risks, benefits, limitations, and alternatives regarding  endoscopy have been reviewed with the patient.  Questions have been answered.  All parties agreeable.   Darlina Rumpf, MD  11/19/2014, 9:28 AM

## 2014-11-19 NOTE — Anesthesia Preprocedure Evaluation (Signed)
Anesthesia Evaluation  Patient identified by MRN, date of birth, ID band Patient awake    Reviewed: Allergy & Precautions, H&P , NPO status , Patient's Chart, lab work & pertinent test results  Airway Mallampati: II  TM Distance: >3 FB Neck ROM: full    Dental no notable dental hx.    Pulmonary former smoker,    Pulmonary exam normal        Cardiovascular hypertension, On Medications Normal cardiovascular exam     Neuro/Psych    GI/Hepatic Neg liver ROS, GERD  ,  Endo/Other  diabetes  Renal/GU negative Renal ROS  negative genitourinary   Musculoskeletal   Abdominal   Peds  Hematology negative hematology ROS (+)   Anesthesia Other Findings   Reproductive/Obstetrics                             Anesthesia Physical Anesthesia Plan  ASA: II  Anesthesia Plan: MAC   Post-op Pain Management:    Induction:   Airway Management Planned:   Additional Equipment:   Intra-op Plan:   Post-operative Plan:   Informed Consent: I have reviewed the patients History and Physical, chart, labs and discussed the procedure including the risks, benefits and alternatives for the proposed anesthesia with the patient or authorized representative who has indicated his/her understanding and acceptance.     Plan Discussed with: CRNA  Anesthesia Plan Comments:         Anesthesia Quick Evaluation

## 2014-11-19 NOTE — Op Note (Signed)
[facility] Gastroenterology Patient Name: Angela Griffith Procedure Date: 11/19/2014 9:59 AM MRN: [id number] Account #: [id number] Date of Birth: 06-04-1960 Admit Type: Outpatient Age: 54 [facility] Gender: Female Note Status: Finalized Procedure:         Upper GI endoscopy Indications:       Epigastric abdominal pain, Heartburn, Nausea with vomiting Providers:         Midge Miniumarren Indiya Izquierdo, MD Referring MD:      Filbert BertholdAmy L. Krebs (Referring MD) Medicines:         Propofol per Anesthesia Complications:     No immediate complications. Procedure:         Pre-Anesthesia Assessment:                    - Prior to the procedure, a History and Physical was                     performed, and patient medications and allergies were                     reviewed. The patient's tolerance of previous anesthesia                     was also reviewed. The risks and benefits of the procedure                     and the sedation options and risks were discussed with the                     patient. All questions were answered, and informed consent                     was obtained. Prior Anticoagulants: The patient has taken                     no previous anticoagulant or antiplatelet agents. ASA                     Grade Assessment: II - A patient with mild systemic                     disease. After reviewing the risks and benefits, the                     patient was deemed in satisfactory condition to undergo                     the procedure.                    After obtaining informed consent, the endoscope was passed                     under direct vision. Throughout the procedure, the                     patient's blood pressure, pulse, and oxygen saturations                     were monitored continuously. The was introduced through                     the mouth, and advanced to the second part of duodenum.  The upper GI endoscopy was accomplished  without                     difficulty. The patient tolerated the procedure well. Findings:      A small hiatus hernia was present.      Localized minimal inflammation characterized by erythema was found in       the gastric antrum. Biopsies were taken with a cold forceps for       histology.      The examined duodenum was normal. Impression:        - Small hiatus hernia.                    - Gastritis. Biopsied.                    - Normal examined duodenum. Recommendation:    - Await pathology results.                    - Continue present medications. Procedure Code(s): --- Professional ---                    [phone], Esophagogastroduodenoscopy, flexible, transoral;                     with biopsy, single or multiple Diagnosis Code(s): --- Professional ---                    R10.13, Epigastric pain                    R12, Heartburn                    R11.2, Nausea with vomiting, unspecified                    K44.9, Diaphragmatic hernia without obstruction or gangrene                    K29.70, Gastritis, unspecified, without bleeding CPT copyright 2014 American Medical Association. All rights reserved. The codes documented in this report are preliminary and upon coder review may  be revised to meet current compliance requirements. [name], MD 11/19/2014 10:14:21 AM This report has been signed electronically. Number of Addenda: 0 Note Initiated On: 11/19/2014 9:59 AM Total Procedure Duration: 0 hours 2 minutes 56 seconds       [facility]

## 2014-11-20 ENCOUNTER — Encounter: Payer: Self-pay | Admitting: Gastroenterology

## 2014-11-22 ENCOUNTER — Encounter: Payer: Self-pay | Admitting: Gastroenterology

## 2014-11-26 ENCOUNTER — Telehealth: Payer: Self-pay | Admitting: Gastroenterology

## 2014-11-26 NOTE — Telephone Encounter (Signed)
Patient called and said she would like to speak to you regarding a procedure.

## 2014-11-27 NOTE — Telephone Encounter (Signed)
LVM for pt to return my call.

## 2014-11-28 NOTE — Telephone Encounter (Signed)
Spoke with pt regarding her symptoms. Pt is still having break through reflux even after taking Dexilant and Zantac. She will continue this until her appt with Dr. Servando SnareWohl. Will discuss this at that visit.

## 2014-11-29 ENCOUNTER — Ambulatory Visit (INDEPENDENT_AMBULATORY_CARE_PROVIDER_SITE_OTHER): Payer: 59 | Admitting: Family Medicine

## 2014-11-29 ENCOUNTER — Encounter: Payer: Self-pay | Admitting: Family Medicine

## 2014-11-29 VITALS — BP 118/76 | HR 73 | Temp 97.5°F | Resp 16 | Ht 65.0 in | Wt 192.8 lb

## 2014-11-29 DIAGNOSIS — E119 Type 2 diabetes mellitus without complications: Secondary | ICD-10-CM

## 2014-11-29 DIAGNOSIS — E785 Hyperlipidemia, unspecified: Secondary | ICD-10-CM | POA: Diagnosis not present

## 2014-11-29 DIAGNOSIS — E1169 Type 2 diabetes mellitus with other specified complication: Secondary | ICD-10-CM

## 2014-11-29 DIAGNOSIS — K449 Diaphragmatic hernia without obstruction or gangrene: Secondary | ICD-10-CM | POA: Diagnosis not present

## 2014-11-29 DIAGNOSIS — I10 Essential (primary) hypertension: Secondary | ICD-10-CM | POA: Diagnosis not present

## 2014-11-29 DIAGNOSIS — K219 Gastro-esophageal reflux disease without esophagitis: Secondary | ICD-10-CM | POA: Diagnosis not present

## 2014-11-29 MED ORDER — LISINOPRIL-HYDROCHLOROTHIAZIDE 10-12.5 MG PO TABS: 1.0000 | ORAL_TABLET | Freq: Every day | ORAL | Status: DC

## 2014-11-29 NOTE — Assessment & Plan Note (Signed)
Pt scheduled for surgical consult regarding repair.

## 2014-11-29 NOTE — Progress Notes (Signed)
Subjective:    Patient ID: [name], female    DOB: 1960-10-09, 54 y.o.   MRN: [id number]  HPI: MYLIA PONDEXTER is a 54 y.o. female presenting on 11/29/2014 for Hypertension; Diabetes; and Hyperlipidemia   Hypertension This is a new problem. The current episode started 1 to 4 weeks ago. The problem has been gradually improving since onset. Pertinent negatives include no anxiety, blurred vision, chest pain, headaches, palpitations, peripheral edema or shortness of breath. Risk factors for coronary artery disease include diabetes mellitus. Past treatments include ACE inhibitors. There is no history of CAD/MI, heart failure or left ventricular hypertrophy.  Diabetes She presents for her follow-up diabetic visit. She has type 2 diabetes mellitus. Her disease course has been improving. Pertinent negatives for hypoglycemia include no dizziness or headaches. Pertinent negatives for diabetes include no blurred vision, no chest pain, no polydipsia, no polyphagia and no polyuria. Symptoms are improving. There are no diabetic complications. Risk factors for coronary artery disease include diabetes mellitus, dyslipidemia and obesity. When asked about current treatments, none were reported. Her weight is stable. She is following a generally healthy diet. Meal planning includes carbohydrate counting and calorie counting. She has had a previous visit with a dietitian. She participates in exercise intermittently. Her overall blood glucose range is 90-110 mg/dl. An ACE inhibitor/angiotensin II receptor blocker is being taken. She does not see a podiatrist.Eye exam is not current.  Hyperlipidemia The current episode started more than 1 month ago. Recent lipid tests were reviewed and are high. Exacerbating diseases include diabetes. Pertinent negatives include no chest pain, focal sensory loss, leg pain, myalgias or shortness of breath. She is currently on no antihyperlipidemic treatment. Compliance  problems include adherence to exercise.  Risk factors for coronary artery disease include diabetes mellitus, dyslipidemia and obesity.    Pt presents for diabetes follow-up. She has been through diabetes education course. She has stopped [place] due to low blood sugars at home. Avg sugars 90-100 in the AM.  SHe was having lows to the 60's when on medication. She has made diet changes. No exercise due to feeling poorly with due to GERD. No eye exam, would like a referral to [facility].   Hypertension: Doing well on lisinopril. Checks BP at home. Taking only  lisinopril and 12.5mg   GERD: Seen Dr. Servando Snare. Has found she has hiatal hernia. She has a surgical consult upcoming. Improving with dexilant.    Past Medical History  Diagnosis Date  . GERD (gastroesophageal reflux disease)   . Colon polyps     last colonoscopy in 2015 nml  . Menstrual migraine   . Diabetes mellitus without complication (HCC)     no meds over 1 month  . Hypertension     Current Outpatient Prescriptions on File Prior to Visit  Medication Sig  . calcium carbonate (TUMS - DOSED IN MG ELEMENTAL CALCIUM) 500 MG chewable tablet Chew 1 tablet by mouth as needed for indigestion or heartburn.  . dexlansoprazole (DEXILANT) 60 MG capsule Take 1 capsule (60 mg total) by mouth daily.  . hydrochlorothiazide (HYDRODIURIL) 25 MG tablet   . lisinopril (PRINIVIL,ZESTRIL) 20 MG tablet Take 1 tablet (20 mg total) by mouth daily.  . ranitidine (ZANTAC) 150 MG tablet Take 150 mg by mouth 2 (two) times daily.   No current facility-administered medications on file prior to visit.    Review of Systems  Constitutional: Negative for fever and chills.  HENT: Negative.   Eyes: Negative for blurred vision.  Respiratory: Negative for shortness of breath.   Cardiovascular: Negative for chest pain, palpitations and leg swelling.  Gastrointestinal: Positive for abdominal pain (related to GERD). Negative for nausea and vomiting.    Endocrine: Negative for cold intolerance, heat intolerance, polydipsia, polyphagia and polyuria.  Genitourinary: Negative.   Musculoskeletal: Negative.  Negative for myalgias.  Neurological: Negative for dizziness, numbness and headaches.  Psychiatric/Behavioral: Negative.    Per HPI unless specifically indicated above     Objective:    BP 118/76 mmHg  Pulse 73  Temp(Src) 97.5 F (36.4 C) (Oral)  Resp 16  Ht 5\' 5"  (1.651 m)  Wt 192 lb 12.8 oz (87.454 kg)  BMI 32.08 kg/m2  Wt Readings from Last 3 Encounters:  11/29/14 192 lb 12.8 oz (87.454 kg)  11/19/14 192 lb (87.091 kg)  11/08/14 195 lb 9.6 oz (88.724 kg)    Physical Exam  Constitutional: She is oriented to person, place, and time. She appears well-developed and well-nourished. No distress.  Neck: Normal range of motion. Neck supple. No thyromegaly present.  Cardiovascular: Normal rate and regular rhythm.  Exam reveals friction rub. Exam reveals no gallop.   No murmur heard. Pulmonary/Chest: Effort normal and breath sounds normal.  Abdominal: Soft. Bowel sounds are normal. There is no tenderness. There is no rebound.  Musculoskeletal: Normal range of motion. She exhibits no edema or tenderness.  Lymphadenopathy:    She has no cervical adenopathy.  Neurological: She is alert and oriented to person, place, and time.  Skin: Skin is warm and dry. She is not diaphoretic.   Results for orders placed or performed during the hospital encounter of 11/02/14  Glucose, capillary  Result Value Ref Range   Glucose-Capillary 79 65 - 99 mg/dL   Comment 1 Notify RN   Comprehensive metabolic panel  Result Value Ref Range   Sodium 139 135 - 145 mmol/L   Potassium 3.7 3.5 - 5.1 mmol/L   Chloride 104 101 - 111 mmol/L   CO2 27 22 - 32 mmol/L   Glucose, Bld 105 (H) 65 - 99 mg/dL   BUN 16 6 - 20 mg/dL   Creatinine, Ser 1.610.72 0.44 - 1.00 mg/dL   Calcium 9.6 8.9 - 09.610.3 mg/dL   Total Protein 7.3 6.5 - 8.1 g/dL   Albumin 4.0 3.5 - 5.0  g/dL   AST 16 15 - 41 U/L   ALT 16 14 - 54 U/L   Alkaline Phosphatase 62 38 - 126 U/L   Total Bilirubin 0.3 0.3 - 1.2 mg/dL   GFR calc non Af Amer >60 >60 mL/min   GFR calc Af Amer >60 >60 mL/min   Anion gap 8 5 - 15  Lipase, blood  Result Value Ref Range   Lipase 21 (L) 22 - 51 U/L  CBC with Differential/Platelet  Result Value Ref Range   WBC 11.8 (H) 3.6 - 11.0 K/uL   RBC 4.52 3.80 - 5.20 MIL/uL   Hemoglobin 12.1 12.0 - 16.0 g/dL   HCT 04.536.8 40.935.0 - 81.147.0 %   MCV 81.3 80.0 - 100.0 fL   MCH 26.7 26.0 - 34.0 pg   MCHC 32.8 32.0 - 36.0 g/dL   RDW 91.414.3 78.211.5 - 95.614.5 %   Platelets 328 150 - 440 K/uL   Neutrophils Relative % 60 %   Neutro Abs 7.1 (H) 1.4 - 6.5 K/uL   Lymphocytes Relative 29 %   Lymphs Abs 3.5 1.0 - 3.6 K/uL   Monocytes Relative 9 %   Monocytes  Absolute 1.0 (H) 0.2 - 0.9 K/uL   Eosinophils Relative 1 %   Eosinophils Absolute 0.1 0 - 0.7 K/uL   Basophils Relative 1 %   Basophils Absolute 0.1 0 - 0.1 K/uL  Troponin I  Result Value Ref Range   Troponin I <0.03 <0.031 ng/mL      Assessment & Plan:   Problem List Items Addressed This Visit      Cardiovascular and Mediastinum   HBP (high blood pressure)    Controlled with 1/2 dose of current medication.  Change to Lisinopril-HCTZ combination.  Alarm symptoms reviewed. DASH diet reviewed. Encouraged physical activity.  ACE for renal protection. RTC 3 mos.       Relevant Medications   lisinopril-hydrochlorothiazide (PRINZIDE,ZESTORETIC) 10-12.5 MG tablet     Respiratory   Hiatal hernia    Pt scheduled for surgical consult regarding repair.         Digestive   GERD without esophagitis    Managed by Dr. Servando Snare. Awaiting surgical consult post endoscopy. Symptoms mildly improved on Dexilant.        Endocrine   Diabetes mellitus without complication (HCC) - Primary    Pt stopped Januvia due to low blood sugars. Will continue with diet and exercise changes only pending A1c Due on 11/19. Consider SGLT-2  inhibitor or GLP-1  if further medication needed ACE for renal protection. Eye exam referral placed.  Foot exam UTD. RTC 3 mos.       Relevant Medications   lisinopril-hydrochlorothiazide (PRINZIDE,ZESTORETIC) 10-12.5 MG tablet   Other Relevant Orders   Hemoglobin A1C   Ambulatory referral to Ophthalmology     Other   Hyperlipidemia associated with type 2 diabetes mellitus (HCC)    Discussed statin recommendations with patient. She prefers to get current health problems under control prior to starting a statin. Recheck Lipid panel in February. Encouraged diet and lifestyle changes.  Encouraged taking daily ASA to prevent heart disease.       Relevant Medications   lisinopril-hydrochlorothiazide (PRINZIDE,ZESTORETIC) 10-12.5 MG tablet      Meds ordered this encounter  Medications  . lisinopril-hydrochlorothiazide (PRINZIDE,ZESTORETIC) 10-12.5 MG tablet    Sig: Take 1 tablet by mouth daily.    Dispense:  90 tablet    Refill:  3    Order Specific Question:  Supervising Provider    Answer:  [name] [[id number]]      Follow up plan: Return in about 3 months (around 03/01/2015) for Diabetes, HTN.

## 2014-11-29 NOTE — Assessment & Plan Note (Signed)
Controlled with 1/2 dose of current medication.  Change to Lisinopril-HCTZ combination.  Alarm symptoms reviewed. DASH diet reviewed. Encouraged physical activity.  ACE for renal protection. RTC 3 mos.

## 2014-11-29 NOTE — Assessment & Plan Note (Signed)
Managed by Dr. Servando SnareWohl. Awaiting surgical consult post endoscopy. Symptoms mildly improved on Dexilant.

## 2014-11-29 NOTE — Patient Instructions (Signed)
Your goal blood pressure is 140/90 Work on low salt/sodium diet - goal <2.5gm (2500mg ) per day. Eat a diet high in fruits/vegetables and whole grains.  Look into mediterranean and DASH diet. Goal activity is 1250min/wk of moderate intensity exercise.  This can be split into 30 minute chunks.  If you are not at this level, you can start with smaller 10-15 min increments and slowly build up activity. Look at www.heart.org for more resources   Please check your blood glucose Once daily. If your glucose is < 70 mg/dl or you have symptoms of hypoglycemia dizziness, headache, hunger, jitteriness and sweating please drink 4 oz of juice or soda.  Check blood glucose 15 minutes later. If it has not risen to >100, please seek medical attention. If > 100 please eat a snack containing protein such as peanut butter and crackers.

## 2014-11-29 NOTE — Assessment & Plan Note (Addendum)
Pt stopped Januvia due to low blood sugars. Will continue with diet and exercise changes only pending A1c Due on 11/19. Consider SGLT-2 inhibitor or GLP-1  if further medication needed ACE for renal protection. Eye exam referral placed.  Foot exam UTD. RTC 3 mos.

## 2014-11-29 NOTE — Assessment & Plan Note (Signed)
Discussed statin recommendations with patient. She prefers to get current health problems under control prior to starting a statin. Recheck Lipid panel in February. Encouraged diet and lifestyle changes.  Encouraged taking daily ASA to prevent heart disease.

## 2014-11-30 ENCOUNTER — Telehealth: Payer: Self-pay

## 2014-11-30 NOTE — Telephone Encounter (Signed)
Would like sooner Eye appt. I saw a note where something is scheduled in January but she states she needs sooner and doctor wanted it sooner. I advised her to wait [place]. Jh

## 2014-12-03 NOTE — Telephone Encounter (Signed)
Called patient and informed that she can call [facility] to see if they have anything sooner. Informed patient that she may go anywhere with [facility] for eye exam. Nothing further needed.

## 2014-12-05 ENCOUNTER — Encounter: Payer: Self-pay | Admitting: General Surgery

## 2014-12-05 ENCOUNTER — Ambulatory Visit: Payer: 59 | Admitting: General Surgery

## 2014-12-05 ENCOUNTER — Ambulatory Visit: Payer: 59 | Admitting: Family Medicine

## 2014-12-05 ENCOUNTER — Encounter (INDEPENDENT_AMBULATORY_CARE_PROVIDER_SITE_OTHER): Payer: Self-pay

## 2014-12-05 VITALS — BP 134/83 | HR 70 | Temp 98.4°F | Ht 65.0 in | Wt 192.0 lb

## 2014-12-05 DIAGNOSIS — R1310 Dysphagia, unspecified: Secondary | ICD-10-CM

## 2014-12-05 DIAGNOSIS — K219 Gastro-esophageal reflux disease without esophagitis: Secondary | ICD-10-CM

## 2014-12-05 DIAGNOSIS — K449 Diaphragmatic hernia without obstruction or gangrene: Secondary | ICD-10-CM

## 2014-12-05 NOTE — Patient Instructions (Addendum)
You will need to have a swallow study and an esophageal manometry prior to being able to scheduling to surgery to make sure that you are a good candidate for this surgery.   We will arrange your swallow study first, if this is normal, then we will go forward with scheduling your manometry.   We will get this scheduled and call you with the details of each of these appointments.   After you have both of these tests done, we will follow-up with you in the office.

## 2014-12-05 NOTE — Progress Notes (Signed)
Patient ID: Angela Griffith, female   DOB: 1960/07/07, 54 y.o.   MRN: [id number]  CC: Epigastric Discomfort  HPI Angela Griffith is a 54 y.o. female  Presents to clinic for evaluation for her midepigastric discomfort and a recent diagnosis of hiatal hernia. She is been followed by internal medicine and gastroenterology for evaluation of her abdominal pain and dysphasia. Patient states that after eating she gets a throbbing sensation within her abdomen that can go over to her right upper quadrant and up her chest and into her neck and head. It does not matter what she eats or when she eats it the symptoms can occur. They primarily started 2 or 3 months ago at the beginning of her workup. She denies any emesis but does state she has been acid sensation into the back of her throat that associates with the symptoms. Patient also complains of occasionally feeling that something gets stuck at the bottom of her esophagus. Of her stomach and has a hard time working through. She's had numerous workups to include right upper quadrant ultrasound , a HIDA scan, upper endoscopy. The ultrasound and HIDA were unremarkable however the upper endoscopy did show a small hiatal hernia and gastritis. Since starting the medication by GI her symptoms have improved. She is very interested in what she can do to keep the symptoms from recurring.  HPI  Past Medical History  Diagnosis Date  . GERD (gastroesophageal reflux disease)   . Colon polyps     last colonoscopy in 2015 nml  . Menstrual migraine   . Hypertension   . Diabetes mellitus without complication [facility])     Patient denies    Past Surgical History  Procedure Laterality Date  . Exploratory laparotomy  1993    for endometriosis  . Esophagogastroduodenoscopy (egd) with propofol N/A 11/19/2014    Procedure: ESOPHAGOGASTRODUODENOSCOPY (EGD) WITH PROPOFOL;  Surgeon: [name], MD;  [facility];  Service: Endoscopy;  Laterality: N/A;  .  Cesarean section  1985 and 1988  . Abdominal hysterectomy  1996    Family History  Problem Relation Age of Onset  . Deep vein thrombosis Father   . Cancer Father     colon  . Alcohol abuse Father   . Hypertension Father   . Anuerysm Maternal Grandmother   . Diabetes Mother   . Stroke Mother   . Hypertension Mother   . Diabetes Maternal Aunt   . Diabetes Maternal Uncle     Social History Social History  Substance Use Topics  . Smoking status: Former Smoker -- 0.25 packs/day for 34 years    Types: Cigarettes    Quit date: 01/19/2014  . Smokeless tobacco: Never Used  . Alcohol Use: No    Allergies  Allergen Reactions  . Sulfa Antibiotics Hives  . Theophyllines Hives  . Codeine Nausea Only  . Demerol [Meperidine] Nausea And Vomiting    Current Outpatient Prescriptions  Medication Sig Dispense Refill  . calcium carbonate (TUMS - DOSED IN MG ELEMENTAL CALCIUM) 500 MG chewable tablet Chew 1 tablet by mouth as needed for indigestion or heartburn.    . dexlansoprazole (DEXILANT) 60 MG capsule Take 1 capsule (60 mg total) by mouth daily. 30 capsule 11  . lisinopril-hydrochlorothiazide (PRINZIDE,ZESTORETIC) 10-12.5 MG tablet Take 1 tablet by mouth daily. 90 tablet 3  . ranitidine (ZANTAC) 150 MG tablet Take 150 mg by mouth 2 (two) times daily.     No current facility-administered medications for this visit.  Review of Systems A  Multipoint review of systems was completed. All pertinent positives and negatives in the history of present illness the remainder were negative.  Physical Exam Blood pressure 134/83, pulse 70, temperature 98.4 F (36.9 C), temperature source Oral, height 5\' 5"  (1.651 m), weight 87.091 kg (192 lb). CONSTITUTIONAL:  Well appearing and in no acute distress. EYES: Pupils are equal, round, and reactive to light, Sclera are non-icteric. EARS, NOSE, MOUTH AND THROAT: The oropharynx is clear. The oral mucosa is pink and moist. Hearing is intact to  voice. LYMPH NODES:  Lymph nodes in the neck are normal. RESPIRATORY:  Lungs are clear. There is normal respiratory effort, with equal breath sounds bilaterally, and without pathologic use of accessory muscles. CARDIOVASCULAR: Heart is regular without murmurs, gallops, or rubs. GI: The abdomen is soft,  Mild tenderness to deep palpation in the midepigastric region, and nondistended. There are no palpable masses. There is no hepatosplenomegaly. Well-healed lower abdominal incision site. There are normal bowel sounds in all quadrants. GU: Rectal deferred.   MUSCULOSKELETAL: Normal muscle strength and tone. No cyanosis or edema.   SKIN: Turgor is good and there are no pathologic skin lesions or ulcers. NEUROLOGIC: Motor and sensation is grossly normal. Cranial nerves are grossly intact. PSYCH:  Oriented to person, place and time. Affect is normal.  Data Reviewed  workup reviewed. Labs are unremarkable. Upper endoscopy shows a small hiatal hernia and gastritis. Ultrasound without cholelithiasis and HIDA scan with ejection fraction of 36%. I have personally reviewed the patient's imaging, laboratory findings and medical records.    Assessment     54 year old female with midepigastric discomfort consistent with hiatal hernia and reflux.     Plan     long conversation with patient about surgical options for reflux and hiatal hernia repair. Discussed that in order for these surgical procedures be safe additional workup is required. Given her reported history of feeling like something gets stuck when she eats is important that we first obtain a swallow study with small bowel follow-through to ensure that this is not actual physiologic problems of her lower esophagus or her stomach to include gastroparesis. She does have a history of diabetes some gastroparesis could potentially putting factor. Also discussed that prior to performing the fundoplication and esophageal manometry is required to ensure that  the esophagus feel muscle movement can handle increased pressure at the lower esophageal sphincter. We will obtain a swallow study first and if that is normal we'll then proceed with manometry. She is to return to clinic after completing the studies.      Time spent with the patient was 60 minutes, with more than 50% of the time spent in face-to-face education, counseling and care coordination.     Ricarda FrameCharles Kameka Whan 12/05/2014, 2:31 PM

## 2014-12-07 ENCOUNTER — Encounter: Payer: Self-pay | Admitting: Emergency Medicine

## 2014-12-07 ENCOUNTER — Telehealth: Payer: Self-pay | Admitting: Family Medicine

## 2014-12-07 ENCOUNTER — Emergency Department: Payer: 59

## 2014-12-07 ENCOUNTER — Emergency Department
Admission: EM | Admit: 2014-12-07 | Discharge: 2014-12-07 | Disposition: A | Discharge status: Home / Self Care | Payer: 59 | Attending: Emergency Medicine | Admitting: Emergency Medicine

## 2014-12-07 DIAGNOSIS — E119 Type 2 diabetes mellitus without complications: Secondary | ICD-10-CM | POA: Diagnosis not present

## 2014-12-07 DIAGNOSIS — I10 Essential (primary) hypertension: Secondary | ICD-10-CM | POA: Diagnosis not present

## 2014-12-07 DIAGNOSIS — E876 Hypokalemia: Secondary | ICD-10-CM | POA: Insufficient documentation

## 2014-12-07 DIAGNOSIS — Z79899 Other long term (current) drug therapy: Secondary | ICD-10-CM | POA: Insufficient documentation

## 2014-12-07 DIAGNOSIS — R531 Weakness: Secondary | ICD-10-CM | POA: Insufficient documentation

## 2014-12-07 DIAGNOSIS — F419 Anxiety disorder, unspecified: Secondary | ICD-10-CM | POA: Diagnosis not present

## 2014-12-07 DIAGNOSIS — Z87891 Personal history of nicotine dependence: Secondary | ICD-10-CM | POA: Insufficient documentation

## 2014-12-07 LAB — URINALYSIS COMPLETE WITH MICROSCOPIC (ARMC ONLY)
BILIRUBIN URINE: NEGATIVE
Bacteria, UA: NONE SEEN
Glucose, UA: NEGATIVE mg/dL
HGB URINE DIPSTICK: NEGATIVE
LEUKOCYTES UA: NEGATIVE
Nitrite: NEGATIVE
PH: 7 (ref 5.0–8.0)
PROTEIN: NEGATIVE mg/dL
Specific Gravity, Urine: 1.003 — ABNORMAL LOW (ref 1.005–1.030)

## 2014-12-07 LAB — BASIC METABOLIC PANEL
ANION GAP: 8 (ref 5–15)
BUN: 13 mg/dL (ref 6–20)
CHLORIDE: 102 mmol/L (ref 101–111)
CO2: 26 mmol/L (ref 22–32)
Calcium: 9 mg/dL (ref 8.9–10.3)
Creatinine, Ser: 0.84 mg/dL (ref 0.44–1.00)
GFR calc non Af Amer: >60 mL/min (ref >60)
Glucose, Bld: 150 mg/dL — ABNORMAL HIGH (ref 65–99)
POTASSIUM: 3.2 mmol/L — AB (ref 3.5–5.1)
SODIUM: 136 mmol/L (ref 135–145)

## 2014-12-07 LAB — TROPONIN I

## 2014-12-07 LAB — HEPATIC FUNCTION PANEL
ALBUMIN: 3.5 g/dL (ref 3.5–5.0)
ALT: 12 U/L — ABNORMAL LOW (ref 14–54)
AST: 17 U/L (ref 15–41)
Alkaline Phosphatase: 61 U/L (ref 38–126)
Bilirubin, Direct: <0.1 mg/dL — ABNORMAL LOW (ref 0.1–0.5)
TOTAL PROTEIN: 6.7 g/dL (ref 6.5–8.1)
Total Bilirubin: 0.8 mg/dL (ref 0.3–1.2)

## 2014-12-07 LAB — CBC
HCT: 35.5 % (ref 35.0–47.0)
HEMOGLOBIN: 11.7 g/dL — AB (ref 12.0–16.0)
MCH: 26.5 pg (ref 26.0–34.0)
MCHC: 32.9 g/dL (ref 32.0–36.0)
MCV: 80.5 fL (ref 80.0–100.0)
Platelets: 320 10*3/uL (ref 150–440)
RBC: 4.41 MIL/uL (ref 3.80–5.20)
RDW: 13.8 % (ref 11.5–14.5)
WBC: 9.5 10*3/uL (ref 3.6–11.0)

## 2014-12-07 LAB — LIPASE, BLOOD: LIPASE: 23 U/L (ref 11–51)

## 2014-12-07 MED ORDER — POTASSIUM CHLORIDE ER 10 MEQ PO TBCR: 20.0000 meq | EXTENDED_RELEASE_TABLET | Freq: Every day | ORAL | Status: DC

## 2014-12-07 MED ORDER — SODIUM CHLORIDE 0.9 % IV BOLUS (SEPSIS)
1000.0000 mL | Freq: Once | INTRAVENOUS | Status: AC
  Administered 2014-12-07: 1000 mL via INTRAVENOUS

## 2014-12-07 MED ORDER — POTASSIUM CHLORIDE CRYS ER 20 MEQ PO TBCR
40.0000 meq | EXTENDED_RELEASE_TABLET | Freq: Once | ORAL | Status: AC
  Administered 2014-12-07: 40 meq via ORAL
  Filled 2014-12-07: qty 2

## 2014-12-07 MED ORDER — ONDANSETRON HCL 4 MG PO TABS: 4.0000 mg | ORAL_TABLET | Freq: Every day | ORAL | Status: DC | PRN

## 2014-12-07 NOTE — Telephone Encounter (Signed)
Hold BP medication  If BP is running low until she follow-ups in the office. Instructions given to patient. She verbalized understanding. Pt is scheduling ER follow-up today.

## 2014-12-07 NOTE — Telephone Encounter (Signed)
Forwarding messege

## 2014-12-07 NOTE — Discharge Instructions (Signed)
°  Return to the emergency room if you feel any significant medical issues have arisen including chest pain, shortness of breath, nausea, vomiting, diarrhea, weakness especially weakness in one particular body, fever, chills or you feel worse in any way. Follow closely with your doctor. Hypokalemia Hypokalemia means that the amount of potassium in the blood is lower than normal.Potassium is a chemical, called an electrolyte, that helps regulate the amount of fluid in the body. It also stimulates muscle contraction and helps nerves function properly.Most of the body's potassium is inside of cells, and only a very small amount is in the blood. Because the amount in the blood is so small, minor changes can be life-threatening. CAUSES  Antibiotics.  Diarrhea or vomiting.  Using laxatives too much, which can cause diarrhea.  Chronic kidney disease.  Water pills (diuretics).  Eating disorders (bulimia).  Low magnesium level.  Sweating a lot. SIGNS AND SYMPTOMS  Weakness.  Constipation.  Fatigue.  Muscle cramps.  Mental confusion.  Skipped heartbeats or irregular heartbeat (palpitations).  Tingling or numbness. DIAGNOSIS  Your health care provider can diagnose hypokalemia with blood tests. In addition to checking your potassium level, your health care provider may also check other lab tests. TREATMENT Hypokalemia can be treated with potassium supplements taken by mouth or adjustments in your current medicines. If your potassium level is very low, you may need to get potassium through a vein (IV) and be monitored in the hospital. A diet high in potassium is also helpful. Foods high in potassium are:  Nuts, such as peanuts and pistachios.  Seeds, such as sunflower seeds and pumpkin seeds.  Peas, lentils, and lima beans.  Whole grain and bran cereals and breads.  Fresh fruit and vegetables, such as apricots, avocado, bananas, cantaloupe, kiwi, oranges, tomatoes, asparagus, and  potatoes.  Orange and tomato juices.  Red meats.  Fruit yogurt. HOME CARE INSTRUCTIONS  Take all medicines as prescribed by your health care provider.  Maintain a healthy diet by including nutritious food, such as fruits, vegetables, nuts, whole grains, and lean meats.  If you are taking a laxative, be sure to follow the directions on the label. SEEK MEDICAL CARE IF:  Your weakness gets worse.  You feel your heart pounding or racing.  You are vomiting or having diarrhea.  You are diabetic and having trouble keeping your blood glucose in the normal range. SEEK IMMEDIATE MEDICAL CARE IF:  You have chest pain, shortness of breath, or dizziness.  You are vomiting or having diarrhea for more than 2 days.  You faint. MAKE SURE YOU:   Understand these instructions.  Will watch your condition.  Will get help right away if you are not doing well or get worse.   This information is not intended to replace advice given to you by your health care provider. Make sure you discuss any questions you have with your health care provider.   Document Released: 01/05/2005 Document Revised: 01/26/2014 Document Reviewed: 07/08/2012 Elsevier Interactive Patient Education [employer]2016 Elsevier Inc.

## 2014-12-07 NOTE — ED Provider Notes (Signed)
[facility] Emergency Department Provider Note  ____________________________________________   I have reviewed the triage vital signs and the nursing notes.   HISTORY  Chief Complaint Weakness    HPI Angela Griffith is a 54 y.o. female with a history of stomach pain for 4 months, has had endoscopies HIDA scans and extensive workup all of which resulted in the diagnosis of hiatal hernia. She states she saw her surgeon about possibly repairing the hiatal hernia 2 days ago because of her symptomology, she was fine yesterday but then the pain came back again yesterday. Hurts to eat. She is not had any rectal bleeding. She states that she has been very anxious about her hiatal hernia. She woke up this morning and could not get back to sleep and then felt generally weak all over and came in to the hospital by ambulance. The patient has not been having any abdominal pain since arrival. She states that there is no change in her chronic pain, she has not vomited but felt somewhat nauseated. She denies any chest pain shortness of breath leg swelling or diarrhea. No melena no bright red blood per rectum no hematemesis.   Past Medical History  Diagnosis Date  . GERD (gastroesophageal reflux disease)   . Colon polyps     last colonoscopy in 2015 nml  . Menstrual migraine   . Hypertension   . Diabetes mellitus without complication [facility])     Patient denies    Patient Active Problem List   Diagnosis Date Noted  . Hiatal hernia 11/29/2014  . Hyperlipidemia associated with type 2 diabetes mellitus (HCC) 11/29/2014  . Abdominal pain, epigastric   . Heartburn   . Non-intractable cyclical vomiting with nausea   . Gastritis   . Obesity, Class I, BMI 30-34.9 11/08/2014  . HBP (high blood pressure) 10/26/2014  . GERD without esophagitis 08/31/2014  . Diabetes mellitus without complication (HCC) 08/31/2014    Past Surgical History  Procedure Laterality Date  .  Exploratory laparotomy  1993    for endometriosis  . Esophagogastroduodenoscopy (egd) with propofol N/A 11/19/2014    Procedure: ESOPHAGOGASTRODUODENOSCOPY (EGD) WITH PROPOFOL;  Surgeon: [name], MD;  Location: [facility];  Service: Endoscopy;  Laterality: N/A;  . Cesarean section  1985 and 1988  . Abdominal hysterectomy  1996    Current Outpatient Rx  Name  Route  Sig  Dispense  Refill  . calcium carbonate (TUMS - DOSED IN MG ELEMENTAL CALCIUM) 500 MG chewable tablet   Oral   Chew 1 tablet by mouth as needed for indigestion or heartburn.         . dexlansoprazole (DEXILANT) 60 MG capsule   Oral   Take 1 capsule (60 mg total) by mouth daily.   30 capsule   11   . lisinopril-hydrochlorothiazide (PRINZIDE,ZESTORETIC) 10-12.5 MG tablet   Oral   Take 1 tablet by mouth daily.   90 tablet   3   . ranitidine (ZANTAC) 150 MG tablet   Oral   Take 150 mg by mouth 2 (two) times daily.           Allergies Sulfa antibiotics; Theophyllines; Codeine; and Demerol  Family History  Problem Relation Age of Onset  . Deep vein thrombosis Father   . Cancer Father     colon  . Alcohol abuse Father   . Hypertension Father   . Anuerysm Maternal Grandmother   . Diabetes Mother   . Stroke Mother   . Hypertension  Mother   . Diabetes Maternal Aunt   . Diabetes Maternal Uncle     Social History Social History  Substance Use Topics  . Smoking status: Former Smoker -- 0.25 packs/day for 34 years    Types: Cigarettes    Quit date: 01/19/2014  . Smokeless tobacco: Never Used  . Alcohol Use: No    Review of Systems Constitutional: No fever/chills Eyes: No visual changes. ENT: No sore throat. No stiff neck no neck pain Cardiovascular: Denies chest pain. Respiratory: Denies shortness of breath. Gastrointestinal:   no vomiting.  No diarrhea.  No constipation. Genitourinary: Negative for dysuria. Musculoskeletal: Negative lower extremity swelling Skin: Negative for  rash. Neurological: Negative for headaches, focal weakness or numbness. 10-point ROS otherwise negative.  ____________________________________________   PHYSICAL EXAM:  VITAL SIGNS: ED Triage Vitals  Enc Vitals Group     BP 12/07/14 0623 133/83 mmHg     Pulse Rate 12/07/14 0623 54     Resp 12/07/14 0623 15     Temp 12/07/14 0623 97.8 F (36.6 C)     Temp Source 12/07/14 0623 Oral     SpO2 12/07/14 0623 100 %     Weight 12/07/14 0623 192 lb (87.091 kg)     Height 12/07/14 0623 5\' 5"  (1.651 m)     Head Cir --      Peak Flow --      Pain Score 12/07/14 0625 7     Pain Loc --      Pain Edu? --      Excl. in GC? --     Constitutional: Alert and oriented. Well appearing and in no acute distress. Quite anxious Eyes: Conjunctivae are normal. PERRL. EOMI. Head: Atraumatic. Nose: No congestion/rhinnorhea. Mouth/Throat: Mucous membranes are moist.  Oropharynx non-erythematous. Neck: No stridor.   Nontender with no meningismus Cardiovascular: Normal rate, regular rhythm. Grossly normal heart sounds.  Good peripheral circulation. Respiratory: Normal respiratory effort.  No retractions. Lungs CTAB. Abdominal: Soft and nontender. No distention. No guarding no rebound Back:  There is no focal tenderness or step off there is no midline tenderness there are no lesions noted. there is no CVA tenderness Musculoskeletal: No lower extremity tenderness. No joint effusions, no DVT signs strong distal pulses no edema Neurologic:  Normal speech and language. No gross focal neurologic deficits are appreciated.  Skin:  Skin is warm, dry and intact. No rash noted. Psychiatric: Mood and affect are normal. Speech and behavior are normal.  ____________________________________________   LABS (all labs ordered are listed, but only abnormal results are displayed)  Labs Reviewed  BASIC METABOLIC PANEL - Abnormal; Notable for the following:    Potassium 3.2 (*)    Glucose, Bld 150 (*)    All other  components within normal limits  CBC - Abnormal; Notable for the following:    Hemoglobin 11.7 (*)    All other components within normal limits  URINALYSIS COMPLETEWITH MICROSCOPIC ([facility] ONLY) - Abnormal; Notable for the following:    Color, Urine COLORLESS (*)    APPearance CLEAR (*)    Ketones, ur TRACE (*)    Specific Gravity, Urine 1.003 (*)    Squamous Epithelial / LPF 0-5 (*)    All other components within normal limits  HEPATIC FUNCTION PANEL - Abnormal; Notable for the following:    ALT 12 (*)    Bilirubin, Direct <0.1 (*)    All other components within normal limits  LIPASE, BLOOD  TROPONIN I   ____________________________________________  EKG  I personally interpreted any EKGs ordered by me or triage Normal sinus occur a rate 52 bpm no acute ST elevation or acute ST depression, or LVH, nonspecific ST changes ____________________________________________  RADIOLOGY  I reviewed any imaging ordered by me or triage that were performed during my shift ____________________________________________   PROCEDURES  Procedure(s) performed: None  Critical Care performed: None  ____________________________________________   INITIAL IMPRESSION / ASSESSMENT AND PLAN / ED COURSE  Pertinent labs & imaging results that were available during my care of the patient were reviewed by me and considered in my medical decision making (see chart for details).  Patient without any chest pain or shortness of breath complains of chronic nausea and abdominal pain which has been extensively worked up. She is very anxious about this. Workup here is reassuring no evidence of acute ischemic event, ACS, PE or dissection, no evidence of ischemic gut no evidence of acute gallbladder issue no evidence of acute AAA or other intra-abdominal catastrophe, patient has no evidence of focal neurologic deficit, her potassium is slightly low and we have repleted that. The patient's abdominal pain is a  chronic issue for her and there is no significant pathology noted on exam. Patient is feeling much better after IV fluids. There are some documented low oxygen saturations with the patient has been taking off her pulse ox and they have been automatically populating in with or waveform. We will obtain a chest x-ray as a precaution but there is low suspicion for any actual pulmonary problem. When I placed the probe on her finger and get a good waveform her sats have been in the 100% range. Patient is eager to go home. His adequate and comprehensive outpatient follow-up for this chronic issue. ____________________________________________   FINAL CLINICAL IMPRESSION(S) / ED DIAGNOSES  Final diagnoses:  None     Jeanmarie Plant, MD 12/07/14 1014

## 2014-12-07 NOTE — ED Notes (Signed)
Per EMS pt woke up with stomach pain and weakness. Per EMS pt has been feeling unwell since she discovered a hernia LUQ. Per pt tonight she felt "so weak" that she had to come in. Pt is a/o with NAD noted.

## 2014-12-07 NOTE — Telephone Encounter (Signed)
Pt is being discharged for ER now.  Her BP has been running low and asked if she should take BP medication.  Please call [phone].

## 2014-12-11 ENCOUNTER — Other Ambulatory Visit
Admission: RE | Admit: 2014-12-11 | Discharge: 2014-12-11 | Disposition: A | Discharge status: Home / Self Care | Payer: 59 | Source: Ambulatory Visit | Attending: Family Medicine | Admitting: Family Medicine

## 2014-12-11 ENCOUNTER — Ambulatory Visit (INDEPENDENT_AMBULATORY_CARE_PROVIDER_SITE_OTHER): Payer: 59 | Admitting: Family Medicine

## 2014-12-11 ENCOUNTER — Encounter: Payer: Self-pay | Admitting: Family Medicine

## 2014-12-11 VITALS — BP 142/82 | HR 98 | Temp 98.7°F | Resp 16 | Ht 65.0 in | Wt 193.2 lb

## 2014-12-11 DIAGNOSIS — K449 Diaphragmatic hernia without obstruction or gangrene: Secondary | ICD-10-CM

## 2014-12-11 DIAGNOSIS — E876 Hypokalemia: Secondary | ICD-10-CM | POA: Diagnosis not present

## 2014-12-11 DIAGNOSIS — R110 Nausea: Secondary | ICD-10-CM

## 2014-12-11 DIAGNOSIS — E785 Hyperlipidemia, unspecified: Secondary | ICD-10-CM | POA: Diagnosis not present

## 2014-12-11 DIAGNOSIS — I10 Essential (primary) hypertension: Secondary | ICD-10-CM

## 2014-12-11 DIAGNOSIS — E1169 Type 2 diabetes mellitus with other specified complication: Secondary | ICD-10-CM

## 2014-12-11 DIAGNOSIS — E119 Type 2 diabetes mellitus without complications: Secondary | ICD-10-CM | POA: Insufficient documentation

## 2014-12-11 LAB — BASIC METABOLIC PANEL
ANION GAP: 8 (ref 5–15)
BUN: 11 mg/dL (ref 6–20)
CHLORIDE: 104 mmol/L (ref 101–111)
CO2: 26 mmol/L (ref 22–32)
CREATININE: 0.9 mg/dL (ref 0.44–1.00)
Calcium: 9.5 mg/dL (ref 8.9–10.3)
GFR calc non Af Amer: >60 mL/min (ref >60)
Glucose, Bld: 89 mg/dL (ref 65–99)
POTASSIUM: 4 mmol/L (ref 3.5–5.1)
Sodium: 138 mmol/L (ref 135–145)

## 2014-12-11 LAB — HEMOGLOBIN A1C: Hgb A1c MFr Bld: 7.2 % — ABNORMAL HIGH (ref 4.0–6.0)

## 2014-12-11 MED ORDER — ONDANSETRON HCL 4 MG PO TABS: 4.0000 mg | ORAL_TABLET | Freq: Every day | ORAL | Status: DC | PRN

## 2014-12-11 MED ORDER — LISINOPRIL 10 MG PO TABS: 10.0000 mg | ORAL_TABLET | Freq: Every day | ORAL | Status: DC

## 2014-12-11 NOTE — Assessment & Plan Note (Signed)
BP elevated in office today without medication. D/C HCTZ and resume lisinopril 10 mg only. Pt instructed to check BP at home. Alarm symptoms reviewed. RTC 1 mos.  BMP today.

## 2014-12-11 NOTE — Addendum Note (Signed)
Addended byLaroy Apple: KREBS, AMY L on: 12/11/2014 03:04 PM   Modules accepted: Orders, SmartSet

## 2014-12-11 NOTE — Patient Instructions (Signed)
I have stopped your diuretic today- this should solve the potassium issue and help you feel better.  If you feel dizzy or weak, check your blood pressure at home. Hold your lisinopril 10 mg dose if BP is <100/60 or you feel dizzy.  Please seek immediate medical attention at ER or Urgent Care if you develop: Chest pain, pressure or tightness. Shortness of breath accompanied by nausea or diaphoresis Visual changes Numbness or tingling on one side of the body Facial droop Altered mental status Or any concerning symptoms.  Continue to follow Dr. Servando SnareWohl and the surgeon's advise regarding your acid reflux.

## 2014-12-11 NOTE — Assessment & Plan Note (Signed)
Check lipid panel today. Discussed statin therapy- pt would like to wait until after surgery to start new medication.

## 2014-12-11 NOTE — Assessment & Plan Note (Signed)
Continue PPI and zantac PRN. Recommend taking zantac 30 minutes prior to potassium pills.

## 2014-12-11 NOTE — Progress Notes (Signed)
Subjective:    Patient ID: Angela Griffith, female    DOB: 06/19/1960, 54 y.o.   MRN: [id number]  HPI: Angela Griffith is a 54 y.o. female presenting on 12/11/2014 for Hypertension and Hospitalization Follow-up   HPI  Pt presents for ER follow-up of hypokalemia and hypertension. Pt went to ER on Friday after feeling weak and nauseated. She was found to have low potassium, d/c'ed on K supplements. FLuids given in ER. Since that time she has not taken BP medication. She is feeling well at home aside from nausea.  Nausea resolved after ER visit but has returned today. However she thinks the K pills are increasing her nausea. GERD symptoms have improved with dexilant and zantac.  Pt will complete potassium pills tomorrow.  Pt due for barium swallow tomorrow, we have surgical consult after that for hiatal hernia.   Past Medical History  Diagnosis Date  . GERD (gastroesophageal reflux disease)   . Colon polyps     last colonoscopy in 2015 nml  . Menstrual migraine   . Hypertension   . Diabetes mellitus without complication [facility])     Patient denies    Current Outpatient Prescriptions on File Prior to Visit  Medication Sig  . calcium carbonate (TUMS - DOSED IN MG ELEMENTAL CALCIUM) 500 MG chewable tablet Chew 1 tablet by mouth as needed for indigestion or heartburn.  . dexlansoprazole (DEXILANT) 60 MG capsule Take 1 capsule (60 mg total) by mouth daily.  . potassium chloride (K-DUR) 10 MEQ tablet Take 2 tablets (20 mEq total) by mouth daily.  . ranitidine (ZANTAC) 150 MG tablet Take 150 mg by mouth 2 (two) times daily.   No current facility-administered medications on file prior to visit.    Review of Systems  Constitutional: Negative for fever and chills.  HENT: Negative.   Respiratory: Negative for chest tightness and shortness of breath.   Cardiovascular: Negative for chest pain, palpitations and leg swelling.  Gastrointestinal: Positive for nausea. Negative for  abdominal pain and abdominal distention.  Endocrine: Negative for cold intolerance, heat intolerance, polydipsia, polyphagia and polyuria.  Genitourinary: Negative.   Musculoskeletal: Negative.   Neurological: Negative for dizziness, syncope, numbness and headaches.  Psychiatric/Behavioral: Negative.    Per HPI unless specifically indicated above     Objective:    BP 142/82 mmHg  Pulse 98  Temp(Src) 98.7 F (37.1 C) (Oral)  Resp 16  Ht  (1.651 m)  Wt 193 lb 3.2 oz (87.635 kg)  BMI 32.15 kg/m2  Wt Readings from Last 3 Encounters:  12/11/14 193 lb 3.2 oz (87.635 kg)  12/07/14 192 lb (87.091 kg)  12/05/14 192 lb (87.091 kg)    Physical Exam  Constitutional: She is oriented to person, place, and time. She appears well-developed and well-nourished. No distress.  Neck: Normal range of motion. Neck supple. No thyromegaly present.  Cardiovascular: Normal rate and regular rhythm.  Exam reveals no gallop and no friction rub.   No murmur heard. Pulmonary/Chest: Effort normal and breath sounds normal.  Abdominal: Soft. Bowel sounds are normal. She exhibits no mass. There is no tenderness. There is no rebound and no guarding.  Musculoskeletal: Normal range of motion. She exhibits no edema or tenderness.  Lymphadenopathy:    She has no cervical adenopathy.  Neurological: She is alert and oriented to person, place, and time.  Skin: Skin is warm and dry. She is not diaphoretic.   Results for orders placed or performed during the hospital encounter of  12/07/14  Basic metabolic panel  Result Value Ref Range   Sodium 136 135 - 145 mmol/L   Potassium 3.2 (L) 3.5 - 5.1 mmol/L   Chloride 102 101 - 111 mmol/L   CO2 26 22 - 32 mmol/L   Glucose, Bld 150 (H) 65 - 99 mg/dL   BUN 13 6 - 20 mg/dL   Creatinine, Ser 0.98 0.44 - 1.00 mg/dL   Calcium 9.0 8.9 - 11.9 mg/dL   GFR calc non Af Amer >60 >60 mL/min   GFR calc Af Amer >60 >60 mL/min   Anion gap 8 5 - 15  CBC  Result Value Ref Range     WBC 9.5 3.6 - 11.0 K/uL   RBC 4.41 3.80 - 5.20 MIL/uL   Hemoglobin 11.7 (L) 12.0 - 16.0 g/dL   HCT 14.7 82.9 - 56.2 %   MCV 80.5 80.0 - 100.0 fL   MCH 26.5 26.0 - 34.0 pg   MCHC 32.9 32.0 - 36.0 g/dL   RDW 13.0 86.5 - 78.4 %   Platelets 320 150 - 440 K/uL  Urinalysis complete, with microscopic ([facility] only)  Result Value Ref Range   Color, Urine COLORLESS (A) YELLOW   APPearance CLEAR (A) CLEAR   Glucose, UA NEGATIVE NEGATIVE mg/dL   Bilirubin Urine NEGATIVE NEGATIVE   Ketones, ur TRACE (A) NEGATIVE mg/dL   Specific Gravity, Urine 1.003 (L) 1.005 - 1.030   Hgb urine dipstick NEGATIVE NEGATIVE   pH 7.0 5.0 - 8.0   Protein, ur NEGATIVE NEGATIVE mg/dL   Nitrite NEGATIVE NEGATIVE   Leukocytes, UA NEGATIVE NEGATIVE   RBC / HPF 0-5 0 - 5 RBC/hpf   WBC, UA 0-5 0 - 5 WBC/hpf   Bacteria, UA NONE SEEN NONE SEEN   Squamous Epithelial / LPF 0-5 (A) NONE SEEN   Mucous PRESENT   Hepatic function panel  Result Value Ref Range   Total Protein 6.7 6.5 - 8.1 g/dL   Albumin 3.5 3.5 - 5.0 g/dL   AST 17 15 - 41 U/L   ALT 12 (L) 14 - 54 U/L   Alkaline Phosphatase 61 38 - 126 U/L   Total Bilirubin 0.8 0.3 - 1.2 mg/dL   Bilirubin, Direct <6.9 (L) 0.1 - 0.5 mg/dL   Indirect Bilirubin NOT CALCULATED 0.3 - 0.9 mg/dL  Lipase, blood  Result Value Ref Range   Lipase 23 11 - 51 U/L  Troponin I  Result Value Ref Range   Troponin I <0.03 <0.031 ng/mL      Assessment & Plan:   Problem List Items Addressed This Visit      Cardiovascular and Mediastinum   HBP (high blood pressure)    BP elevated in office today without medication. D/C HCTZ and resume lisinopril 10 mg only. Pt instructed to check BP at home. Alarm symptoms reviewed. RTC 1 mos.  BMP today.      Relevant Medications   lisinopril (PRINIVIL,ZESTRIL) 10 MG tablet     Respiratory   Hiatal hernia    Continue PPI and zantac PRN. Recommend taking zantac 30 minutes prior to potassium pills.         Other   Hyperlipidemia  associated with type 2 diabetes mellitus (HCC)    Check lipid panel today. Discussed statin therapy- pt would like to wait until after surgery to start new medication.       Relevant Medications   lisinopril (PRINIVIL,ZESTRIL) 10 MG tablet    Other Visit Diagnoses    Hypokalemia    -  Primary    Recheck BMP. HCTZ discontinued to prevent further potassium issues.     Relevant Medications    lisinopril (PRINIVIL,ZESTRIL) 10 MG tablet    Other Relevant Orders    Basic Metabolic Panel (BMET)    Nausea        Relevant Medications    ondansetron (ZOFRAN) 4 MG tablet       Meds ordered this encounter  Medications  . lisinopril (PRINIVIL,ZESTRIL) 10 MG tablet    Sig: Take 1 tablet (10 mg total) by mouth daily.    Dispense:  30 tablet    Refill:  11    Order Specific Question:  Supervising Provider    Answer:  Janeann ForehandHAWKINS JR, JAMES H [id number][[id number]]  . ondansetron (ZOFRAN) 4 MG tablet    Sig: Take 1 tablet (4 mg total) by mouth daily as needed for nausea or vomiting.    Dispense:  10 tablet    Refill:  1    Order Specific Question:  Supervising Provider    Answer:  Janeann ForehandHAWKINS JR, JAMES H [[id number]]      Follow up plan: Return in about 4 weeks (around 01/08/2015) for BP check.

## 2014-12-12 ENCOUNTER — Ambulatory Visit
Admit: 2014-12-12 | Discharge: 2014-12-12 | Disposition: A | Discharge status: Home / Self Care | Payer: 59 | Attending: General Surgery | Admitting: General Surgery

## 2014-12-12 ENCOUNTER — Telehealth: Payer: Self-pay | Admitting: Family Medicine

## 2014-12-12 DIAGNOSIS — R1310 Dysphagia, unspecified: Secondary | ICD-10-CM | POA: Diagnosis present

## 2014-12-12 NOTE — Telephone Encounter (Signed)
Patient aware.[place] 

## 2014-12-12 NOTE — Telephone Encounter (Signed)
Called pt to review lab results. LMTCB.  A1c is 7.2%- this is down from her previous. Keep up the good work, we will check in 3 mos. BMP: Her potassium is back to normal.

## 2014-12-17 ENCOUNTER — Ambulatory Visit: Payer: 59

## 2014-12-18 ENCOUNTER — Telehealth: Payer: Self-pay | Admitting: Dietician

## 2014-12-18 NOTE — Telephone Encounter (Signed)
Called patient to reschedule missed class (missed on 12/17/14). She states she does not need to come as she has stopped Diabetes medication, BGs doing well.

## 2014-12-19 ENCOUNTER — Telehealth: Payer: Self-pay | Admitting: Gastroenterology

## 2014-12-19 ENCOUNTER — Telehealth: Payer: Self-pay | Admitting: Family Medicine

## 2014-12-19 DIAGNOSIS — K449 Diaphragmatic hernia without obstruction or gangrene: Secondary | ICD-10-CM

## 2014-12-19 MED ORDER — HYDROCHLOROTHIAZIDE 25 MG PO TABS: 12.5000 mg | ORAL_TABLET | Freq: Every day | ORAL | Status: DC

## 2014-12-19 NOTE — Telephone Encounter (Signed)
Pt just checked her BP, it's 188/114 in right arm and 183/103 in left.  She took the lisinopril earlier today and asked if she needed to start back on the hydrochlorothiazide.  Please call [phone] before 5 or [phone] after

## 2014-12-19 NOTE — Telephone Encounter (Signed)
Patient left a voice message that she had a swallow test. When will she have her other test?

## 2014-12-19 NOTE — Telephone Encounter (Signed)
Called pt to discuss BP.  Told her to take her HCTZ 25mg  tab at home. Recheck BP in 30 minutes to 1 hour. If not going down, go to urgent care for evaluation. If she develops symptoms of CP, HA, visual changes, numbness tingling on one side of body, of facial droop go to ER.  She will need to be seen soon to evaluate blood pressure.

## 2014-12-20 ENCOUNTER — Telehealth: Payer: Self-pay | Admitting: Family Medicine

## 2014-12-20 ENCOUNTER — Other Ambulatory Visit: Payer: Self-pay | Admitting: *Deleted

## 2014-12-20 NOTE — Telephone Encounter (Signed)
Pt  Have  Questions about medication

## 2014-12-21 ENCOUNTER — Ambulatory Visit (INDEPENDENT_AMBULATORY_CARE_PROVIDER_SITE_OTHER): Payer: 59 | Admitting: Family Medicine

## 2014-12-21 ENCOUNTER — Encounter: Payer: Self-pay | Admitting: Family Medicine

## 2014-12-21 ENCOUNTER — Encounter: Payer: Self-pay | Admitting: *Deleted

## 2014-12-21 VITALS — BP 132/84 | HR 71 | Temp 98.7°F | Resp 16 | Ht 65.0 in | Wt 190.6 lb

## 2014-12-21 DIAGNOSIS — K219 Gastro-esophageal reflux disease without esophagitis: Secondary | ICD-10-CM

## 2014-12-21 DIAGNOSIS — R1013 Epigastric pain: Secondary | ICD-10-CM

## 2014-12-21 DIAGNOSIS — I10 Essential (primary) hypertension: Secondary | ICD-10-CM

## 2014-12-21 MED ORDER — AMLODIPINE BESYLATE 2.5 MG PO TABS: 2.5000 mg | ORAL_TABLET | Freq: Every day | ORAL | Status: DC

## 2014-12-21 NOTE — Telephone Encounter (Addendum)
Called Endoscopy at this time. Spoke with Trish. Patient scheduled for Esophageal Manometry on 01/30/15. Order placed. H&P in EPIC.  Called patient. No answer. Left voicemail for return phone call.  Information placed in envelope to mail to patient. Need to verify address prior to sending.

## 2014-12-21 NOTE — Assessment & Plan Note (Signed)
Pt instructed to take dexilant daily to help with GERD symptoms.  I think reducing pain will help control BP fluctuations.

## 2014-12-21 NOTE — Patient Instructions (Signed)
Continue to take your Lisinopril 10mg  daily.  Take amlodipine 2.5mg  daily when BP is >150/90.  Please call if BP is consistently > 180/100 or you are having symptoms such as headache, visual changes, or chest pain.

## 2014-12-21 NOTE — Assessment & Plan Note (Signed)
BP fluctuates due to pain. Stop HCTZ due to sulfa allergy and itching. Restart amlodipine 2.5mg  for BP > 150/90. Hold on days where BP is controlled. Continue lisinopril 10 mg daily.  Recheck BP in 1 mos. Pt will call clinic or seek medical attention with consistently elevated BP > 180/100

## 2014-12-21 NOTE — Progress Notes (Signed)
Subjective:    Patient ID: Angela Griffith, female    DOB: 1960-01-25, 54 y.o.   MRN: [id number]  HPI: Angela Griffith is a 54 y.o. female presenting on 12/21/2014 for Hypertension   HPI  Pt presents for fluctuating blood pressure. BP was 180/100 on Wednesday.  She took extra dose of HCTZ.  Pt reports itching with HCTZ. When she takes lisinopril and HCTZ- her blood pressure runs low.  BP is high when she experiences pain/GERD symptoms from her hiatal hernia. Pt denies CP, SOB, visual changes, or HA.   Pt reports she is not taking dexilant daily.   Past Medical History  Diagnosis Date  . GERD (gastroesophageal reflux disease)   . Colon polyps     last colonoscopy in 2015 nml  . Menstrual migraine   . Hypertension   . Diabetes mellitus without complication [facility](HCC)     Patient denies    Current Outpatient Prescriptions on File Prior to Visit  Medication Sig  . calcium carbonate (TUMS - DOSED IN MG ELEMENTAL CALCIUM) 500 MG chewable tablet Chew 1 tablet by mouth as needed for indigestion or heartburn.  . dexlansoprazole (DEXILANT) 60 MG capsule Take 1 capsule (60 mg total) by mouth daily.  Marland Kitchen. lisinopril (PRINIVIL,ZESTRIL) 10 MG tablet Take 1 tablet (10 mg total) by mouth daily.  . ondansetron (ZOFRAN) 4 MG tablet Take 1 tablet (4 mg total) by mouth daily as needed for nausea or vomiting.  . potassium chloride (K-DUR) 10 MEQ tablet Take 2 tablets (20 mEq total) by mouth daily.  . ranitidine (ZANTAC) 150 MG tablet Take 150 mg by mouth 2 (two) times daily.   No current facility-administered medications on file prior to visit.    Review of Systems  Constitutional: Negative for fever and chills.  HENT: Negative.   Respiratory: Negative for chest tightness and shortness of breath.   Cardiovascular: Negative for chest pain, palpitations and leg swelling.  Gastrointestinal: Positive for nausea and abdominal pain. Negative for abdominal distention.  Endocrine: Negative for cold  intolerance, heat intolerance, polydipsia, polyphagia and polyuria.  Genitourinary: Negative.   Musculoskeletal: Negative.   Neurological: Negative for dizziness, numbness and headaches.  Psychiatric/Behavioral: Negative.    Per HPI unless specifically indicated above     Objective:    BP 132/84 mmHg  Pulse 71  Temp(Src) 98.7 F (37.1 C) (Oral)  Resp 16  Ht 5\' 5"  (1.651 m)  Wt 190 lb 9.6 oz (86.456 kg)  BMI 31.72 kg/m2  Wt Readings from Last 3 Encounters:  12/21/14 190 lb 9.6 oz (86.456 kg)  12/11/14 193 lb 3.2 oz (87.635 kg)  12/07/14 192 lb (87.091 kg)    Physical Exam  Constitutional: She is oriented to person, place, and time. She appears well-developed and well-nourished. No distress.  Neck: Normal range of motion. Neck supple. No thyromegaly present.  Cardiovascular: Normal rate and regular rhythm.  Exam reveals no gallop and no friction rub.   No murmur heard. Pulmonary/Chest: Effort normal and breath sounds normal.  Abdominal: Soft. Bowel sounds are normal. There is no tenderness. There is no rebound.  Musculoskeletal: Normal range of motion. She exhibits no edema or tenderness.  Lymphadenopathy:    She has no cervical adenopathy.  Neurological: She is alert and oriented to person, place, and time.  Skin: Skin is warm and dry. She is not diaphoretic.   Results for orders placed or performed during the hospital encounter of 12/11/14  Hemoglobin A1c  Result Value Ref Range  Hgb A1c MFr Bld 7.2 (H) 4.0 - 6.0 %  Basic metabolic panel  Result Value Ref Range   Sodium 138 135 - 145 mmol/L   Potassium 4.0 3.5 - 5.1 mmol/L   Chloride 104 101 - 111 mmol/L   CO2 26 22 - 32 mmol/L   Glucose, Bld 89 65 - 99 mg/dL   BUN 11 6 - 20 mg/dL   Creatinine, Ser 6.21 0.44 - 1.00 mg/dL   Calcium 9.5 8.9 - 30.8 mg/dL   GFR calc non Af Amer >60 >60 mL/min   GFR calc Af Amer >60 >60 mL/min   Anion gap 8 5 - 15      Assessment & Plan:   Problem List Items Addressed This Visit       Cardiovascular and Mediastinum   HBP (high blood pressure) - Primary    BP fluctuates due to pain. Stop HCTZ due to sulfa allergy and itching. Restart amlodipine 2.5mg  for BP > 150/90. Hold on days where BP is controlled. Continue lisinopril 10 mg daily.  Recheck BP in 1 mos. Pt will call clinic or seek medical attention with consistently elevated BP > 180/100       Relevant Medications   amLODipine (NORVASC) 2.5 MG tablet     Digestive   GERD without esophagitis    Pt instructed to take dexilant daily to help with GERD symptoms.  I think reducing pain will help control BP fluctuations.         Other   Abdominal pain, epigastric    Pain contributing to BP issues. Pt encouraged to take GERD medications daily.          Meds ordered this encounter  Medications  . amLODipine (NORVASC) 2.5 MG tablet    Sig: Take 1 tablet (2.5 mg total) by mouth daily.    Dispense:  30 tablet    Refill:  3    Order Specific Question:  Supervising Provider    Answer:  [name] [[id number]]      Follow up plan: Return in about 4 weeks (around 01/18/2015) for BP.

## 2014-12-21 NOTE — Assessment & Plan Note (Signed)
Pain contributing to BP issues. Pt encouraged to take GERD medications daily.

## 2014-12-21 NOTE — Addendum Note (Signed)
Addended by: Arty BaumgartnerSTRUNK, Jaydrian Corpening G on: 12/21/2014 03:57 PM   Modules accepted: Orders, Medications

## 2014-12-24 ENCOUNTER — Telehealth: Payer: Self-pay

## 2014-12-24 ENCOUNTER — Ambulatory Visit: Payer: 59

## 2014-12-24 NOTE — Telephone Encounter (Signed)
Patient called in this morning. Information regarding appointment given and patient read back information over the phone. Follow-up appointment with Dr. Tonita CongWoodham was also made during this call. Patient's address was verified over the phone and paperwork will be sent to patient via mail today.  Answered all questions to patient's satisfaction.  Encouraged patient to call and ask questions, should she have any after reading paperwork.

## 2014-12-25 ENCOUNTER — Emergency Department
Admission: EM | Admit: 2014-12-25 | Discharge: 2014-12-25 | Disposition: A | Discharge status: Home / Self Care | Payer: 59 | Attending: Emergency Medicine | Admitting: Emergency Medicine

## 2014-12-25 DIAGNOSIS — I10 Essential (primary) hypertension: Secondary | ICD-10-CM | POA: Diagnosis present

## 2014-12-25 DIAGNOSIS — Z79899 Other long term (current) drug therapy: Secondary | ICD-10-CM | POA: Insufficient documentation

## 2014-12-25 DIAGNOSIS — E785 Hyperlipidemia, unspecified: Secondary | ICD-10-CM | POA: Diagnosis not present

## 2014-12-25 DIAGNOSIS — E1169 Type 2 diabetes mellitus with other specified complication: Secondary | ICD-10-CM | POA: Diagnosis not present

## 2014-12-25 DIAGNOSIS — Z87891 Personal history of nicotine dependence: Secondary | ICD-10-CM | POA: Diagnosis not present

## 2014-12-25 MED ORDER — PANTOPRAZOLE SODIUM 40 MG PO TBEC
40.0000 mg | DELAYED_RELEASE_TABLET | Freq: Once | ORAL | Status: AC
  Administered 2014-12-25: 40 mg via ORAL

## 2014-12-25 MED ORDER — LISINOPRIL 10 MG PO TABS
10.0000 mg | ORAL_TABLET | Freq: Once | ORAL | Status: AC
  Administered 2014-12-25: 10 mg via ORAL
  Filled 2014-12-25: qty 1

## 2014-12-25 MED ORDER — PANTOPRAZOLE SODIUM 40 MG PO TBEC: 40.0000 mg | DELAYED_RELEASE_TABLET | Freq: Every day | ORAL | Status: DC

## 2014-12-25 MED ORDER — PANTOPRAZOLE SODIUM 40 MG PO TBEC
DELAYED_RELEASE_TABLET | ORAL | Status: AC
  Administered 2014-12-25: 40 mg via ORAL
  Filled 2014-12-25: qty 1

## 2014-12-25 NOTE — Discharge Instructions (Signed)
Hypertension Hypertension, commonly called high blood pressure, is when the force of blood pumping through your arteries is too strong. Your arteries are the blood vessels that carry blood from your heart throughout your body. A blood pressure reading consists of a higher number over a lower number, such as 110/72. The higher number (systolic) is the pressure inside your arteries when your heart pumps. The lower number (diastolic) is the pressure inside your arteries when your heart relaxes. Ideally you want your blood pressure below 120/80. Hypertension forces your heart to work harder to pump blood. Your arteries may become narrow or stiff. Having untreated or uncontrolled hypertension can cause heart attack, stroke, kidney disease, and other problems. RISK FACTORS Some risk factors for high blood pressure are controllable. Others are not.  Risk factors you cannot control include:   Race. You may be at higher risk if you are African American.  Age. Risk increases with age.  Gender. Men are at higher risk than women before age 45 years. After age 65, women are at higher risk than men. Risk factors you can control include:  Not getting enough exercise or physical activity.  Being overweight.  Getting too much fat, sugar, calories, or salt in your diet.  Drinking too much alcohol. SIGNS AND SYMPTOMS Hypertension does not usually cause signs or symptoms. Extremely high blood pressure (hypertensive crisis) may cause headache, anxiety, shortness of breath, and nosebleed. DIAGNOSIS To check if you have hypertension, your health care provider will measure your blood pressure while you are seated, with your arm held at the level of your heart. It should be measured at least twice using the same arm. Certain conditions can cause a difference in blood pressure between your right and left arms. A blood pressure reading that is higher than normal on one occasion does not mean that you need treatment. If  it is not clear whether you have high blood pressure, you may be asked to return on a different day to have your blood pressure checked again. Or, you may be asked to monitor your blood pressure at home for 1 or more weeks. TREATMENT Treating high blood pressure includes making lifestyle changes and possibly taking medicine. Living a healthy lifestyle can help lower high blood pressure. You may need to change some of your habits. Lifestyle changes may include:  Following the DASH diet. This diet is high in fruits, vegetables, and whole grains. It is low in salt, red meat, and added sugars.  Keep your sodium intake below 2,300 mg per day.  Getting at least 30-45 minutes of aerobic exercise at least 4 times per week.  Losing weight if necessary.  Not smoking.  Limiting alcoholic beverages.  Learning ways to reduce stress. Your health care provider may prescribe medicine if lifestyle changes are not enough to get your blood pressure under control, and if one of the following is true:  You are 18-59 years of age and your systolic blood pressure is above 140.  You are 60 years of age or older, and your systolic blood pressure is above 150.  Your diastolic blood pressure is above 90.  You have diabetes, and your systolic blood pressure is over 140 or your diastolic blood pressure is over 90.  You have kidney disease and your blood pressure is above 140/90.  You have heart disease and your blood pressure is above 140/90. Your personal target blood pressure may vary depending on your medical conditions, your age, and other factors. HOME CARE INSTRUCTIONS    Have your blood pressure rechecked as directed by your health care provider.   Take medicines only as directed by your health care provider. Follow the directions carefully. Blood pressure medicines must be taken as prescribed. The medicine does not work as well when you skip doses. Skipping doses also puts you at risk for  problems.  Do not smoke.   Monitor your blood pressure at home as directed by your health care provider. SEEK MEDICAL CARE IF:   You think you are having a reaction to medicines taken.  You have recurrent headaches or feel dizzy.  You have swelling in your ankles.  You have trouble with your vision. SEEK IMMEDIATE MEDICAL CARE IF:  You develop a severe headache or confusion.  You have unusual weakness, numbness, or feel faint.  You have severe chest or abdominal pain.  You vomit repeatedly.  You have trouble breathing. MAKE SURE YOU:   Understand these instructions.  Will watch your condition.  Will get help right away if you are not doing well or get worse.   This information is not intended to replace advice given to you by your health care provider. Make sure you discuss any questions you have with your health care provider.   Document Released: 01/05/2005 Document Revised: 05/22/2014 Document Reviewed: 10/28/2012 Elsevier Interactive Patient Education 2016 [employer]  

## 2014-12-25 NOTE — ED Provider Notes (Signed)
[facility] Emergency Department Provider Note  ____________________________________________  Time seen: 5:00 AM  I have reviewed the triage vital signs and the nursing notes.   HISTORY  Chief Complaint Hypertension    HPI Angela Griffith is a 54 y.o. female presents with elevated blood pressure systolic blood pressure greater than 230 per patient at home. Patient denies any chest pain no shortness of breath. Patient denies any nausea or vomiting. Of note patient states that she recently had a change in her blood pressure medication management she states that she was taking lisinopril 10 mg tablet as well as HCTZ however HCTZ was discontinued secondary to sulfa allergy. Patient denies any headache at this time or change in vision no weakness or numbness. Patient denies any gait instability     Past Medical History  Diagnosis Date  . GERD (gastroesophageal reflux disease)   . Colon polyps     last colonoscopy in 2015 nml  . Menstrual migraine   . Hypertension   . Diabetes mellitus without complication [facility])     Patient denies    Patient Active Problem List   Diagnosis Date Noted  . Hiatal hernia 11/29/2014  . Hyperlipidemia associated with type 2 diabetes mellitus (HCC) 11/29/2014  . Abdominal pain, epigastric   . Heartburn   . Non-intractable cyclical vomiting with nausea   . Gastritis   . Obesity, Class I, BMI 30-34.9 11/08/2014  . HBP (high blood pressure) 10/26/2014  . GERD without esophagitis 08/31/2014  . Diabetes mellitus without complication (HCC) 08/31/2014    Past Surgical History  Procedure Laterality Date  . Exploratory laparotomy  1993    for endometriosis  . Esophagogastroduodenoscopy (egd) with propofol N/A 11/19/2014    Procedure: ESOPHAGOGASTRODUODENOSCOPY (EGD) WITH PROPOFOL;  Surgeon: [name], MD;  Location: [facility];  Service: Endoscopy;  Laterality: N/A;  . Cesarean section  1985 and 1988  . Abdominal  hysterectomy  1996    Current Outpatient Rx  Name  Route  Sig  Dispense  Refill  . amLODipine (NORVASC) 2.5 MG tablet   Oral   Take 1 tablet (2.5 mg total) by mouth daily.   30 tablet   3   . calcium carbonate (TUMS - DOSED IN MG ELEMENTAL CALCIUM) 500 MG chewable tablet   Oral   Chew 1 tablet by mouth as needed for indigestion or heartburn.         . dexlansoprazole (DEXILANT) 60 MG capsule   Oral   Take 1 capsule (60 mg total) by mouth daily.   30 capsule   11   . lisinopril (PRINIVIL,ZESTRIL) 10 MG tablet   Oral   Take 1 tablet (10 mg total) by mouth daily.   30 tablet   11   . ondansetron (ZOFRAN) 4 MG tablet   Oral   Take 1 tablet (4 mg total) by mouth daily as needed for nausea or vomiting.   10 tablet   1   . ranitidine (ZANTAC) 150 MG tablet   Oral   Take 150 mg by mouth 2 (two) times daily.         . potassium chloride (K-DUR) 10 MEQ tablet   Oral   Take 2 tablets (20 mEq total) by mouth daily. Patient not taking: Reported on 12/25/2014   5 tablet   0     Allergies Sulfa antibiotics; Theophyllines; Codeine; Hydrochlorothiazide; and Demerol  Family History  Problem Relation Age of Onset  . Deep vein thrombosis Father   .  Cancer Father     colon  . Alcohol abuse Father   . Hypertension Father   . Anuerysm Maternal Grandmother   . Diabetes Mother   . Stroke Mother   . Hypertension Mother   . Diabetes Maternal Aunt   . Diabetes Maternal Uncle     Social History Social History  Substance Use Topics  . Smoking status: Former Smoker -- 0.25 packs/day for 34 years    Types: Cigarettes    Quit date: 01/19/2014  . Smokeless tobacco: Never Used  . Alcohol Use: No    Review of Systems  Constitutional: Negative for fever. Eyes: Negative for visual changes. ENT: Negative for sore throat. Cardiovascular: Negative for chest pain. Respiratory: Negative for shortness of breath. Gastrointestinal: Negative for abdominal pain, vomiting and  diarrhea. Genitourinary: Negative for dysuria. Musculoskeletal: Negative for back pain. Skin: Negative for rash. Neurological: Negative for headaches, focal weakness or numbness.   10-point ROS otherwise negative.  ____________________________________________   PHYSICAL EXAM:  VITAL SIGNS: ED Triage Vitals  Enc Vitals Group     BP 12/25/14 0054 178/100 mmHg     Pulse Rate 12/25/14 0054 70     Resp 12/25/14 0054 18     Temp 12/25/14 0054 97.6 F (36.4 C)     Temp Source 12/25/14 0054 Oral     SpO2 12/25/14 0054 98 %     Weight 12/25/14 0054 190 lb (86.183 kg)     Height 12/25/14 0054 5\' 5"  (1.651 m)     Head Cir --      Peak Flow --      Pain Score 12/25/14 0055 6     Pain Loc --      Pain Edu? --      Excl. in GC? --     Constitutional: Alert and oriented. Well appearing and in no distress. Eyes: Conjunctivae are normal. PERRL. Normal extraocular movements. ENT   Head: Normocephalic and atraumatic.   Nose: No congestion/rhinnorhea.   Mouth/Throat: Mucous membranes are moist.   Neck: No stridor. Hematological/Lymphatic/Immunilogical: No cervical lymphadenopathy. Cardiovascular: Normal rate, regular rhythm. Normal and symmetric distal pulses are present in all extremities. No murmurs, rubs, or gallops. Respiratory: Normal respiratory effort without tachypnea nor retractions. Breath sounds are clear and equal bilaterally. No wheezes/rales/rhonchi. Gastrointestinal: Soft and nontender. No distention. There is no CVA tenderness. Genitourinary: deferred Musculoskeletal: Nontender with normal range of motion in all extremities. No joint effusions.  No lower extremity tenderness nor edema. Neurologic:  Normal speech and language. No gross focal neurologic deficits are appreciated. Speech is normal.  Skin:  Skin is warm, dry and intact. No rash noted. Psychiatric: Mood and affect are normal. Speech and behavior are normal. Patient exhibits appropriate insight and  judgment.  ED ECG REPORT I, Alexzia Kasler, [place], the attending physician, personally viewed and interpreted this ECG.   Date: 12/25/2014  EKG Time: 1:37 AM  Rate: 58  Rhythm: Sinus bradycardia  Axis: None  Intervals: Normal  ST&T Change: None   INITIAL IMPRESSION / ASSESSMENT AND PLAN / ED COURSE  Pertinent labs & imaging results that were available during my care of the patient were reviewed by me and considered in my medical decision making (see chart for details).  Patient presenting with hypertension blood pressure 178/100 on presentation. I suspect the patient's elevated blood pressure at this time secondary to recent blood pressure management change. No evidence of end organ damage at this time patient has no chest pain shortness of breath  headache nausea or vomiting. Patient received an additional dose of lisinopril 10 mg tablet now  ____________________________________________   FINAL CLINICAL IMPRESSION(S) / ED DIAGNOSES  Final diagnoses:  Essential hypertension      Darci Current, MD 12/25/14 [phone]

## 2014-12-25 NOTE — ED Notes (Signed)
Recent change in blood pressure medicine. Presents tonight because she had a high reading tonight at home and was concerned. Denies N/V. States associated headache. Denies chest pain, shortness of breath, or diaphoresis. Patient alert and able to answer questions appropriately.

## 2014-12-25 NOTE — ED Notes (Signed)
Per EDP no blood work at this time.

## 2014-12-25 NOTE — ED Notes (Signed)
PT resting, denies any needs at this time.

## 2014-12-26 ENCOUNTER — Emergency Department
Admission: EM | Admit: 2014-12-26 | Discharge: 2014-12-27 | Disposition: A | Discharge status: Home / Self Care | Payer: 59 | Attending: Emergency Medicine | Admitting: Emergency Medicine

## 2014-12-26 ENCOUNTER — Encounter: Payer: Self-pay | Admitting: Emergency Medicine

## 2014-12-26 ENCOUNTER — Telehealth: Payer: Self-pay

## 2014-12-26 DIAGNOSIS — Z79899 Other long term (current) drug therapy: Secondary | ICD-10-CM | POA: Diagnosis not present

## 2014-12-26 DIAGNOSIS — E785 Hyperlipidemia, unspecified: Secondary | ICD-10-CM | POA: Diagnosis not present

## 2014-12-26 DIAGNOSIS — E1169 Type 2 diabetes mellitus with other specified complication: Secondary | ICD-10-CM | POA: Insufficient documentation

## 2014-12-26 DIAGNOSIS — Z87891 Personal history of nicotine dependence: Secondary | ICD-10-CM | POA: Insufficient documentation

## 2014-12-26 DIAGNOSIS — I10 Essential (primary) hypertension: Secondary | ICD-10-CM | POA: Insufficient documentation

## 2014-12-26 DIAGNOSIS — R1013 Epigastric pain: Secondary | ICD-10-CM | POA: Diagnosis not present

## 2014-12-26 LAB — COMPREHENSIVE METABOLIC PANEL
ALBUMIN: 3.7 g/dL (ref 3.5–5.0)
ALT: 13 U/L — ABNORMAL LOW (ref 14–54)
ANION GAP: 5 (ref 5–15)
AST: 14 U/L — ABNORMAL LOW (ref 15–41)
Alkaline Phosphatase: 57 U/L (ref 38–126)
BILIRUBIN TOTAL: 0.5 mg/dL (ref 0.3–1.2)
BUN: 12 mg/dL (ref 6–20)
CO2: 26 mmol/L (ref 22–32)
Calcium: 9.2 mg/dL (ref 8.9–10.3)
Chloride: 108 mmol/L (ref 101–111)
Creatinine, Ser: 0.82 mg/dL (ref 0.44–1.00)
Glucose, Bld: 143 mg/dL — ABNORMAL HIGH (ref 65–99)
Potassium: 3.9 mmol/L (ref 3.5–5.1)
Sodium: 139 mmol/L (ref 135–145)
TOTAL PROTEIN: 7.1 g/dL (ref 6.5–8.1)

## 2014-12-26 LAB — CBC
HEMATOCRIT: 36 % (ref 35.0–47.0)
HEMOGLOBIN: 11.7 g/dL — AB (ref 12.0–16.0)
MCH: 26.4 pg (ref 26.0–34.0)
MCHC: 32.5 g/dL (ref 32.0–36.0)
MCV: 81.2 fL (ref 80.0–100.0)
Platelets: 335 10*3/uL (ref 150–440)
RBC: 4.43 MIL/uL (ref 3.80–5.20)
RDW: 14.8 % — ABNORMAL HIGH (ref 11.5–14.5)
WBC: 13.1 10*3/uL — AB (ref 3.6–11.0)

## 2014-12-26 LAB — LIPASE, BLOOD: Lipase: 28 U/L (ref 11–51)

## 2014-12-26 MED ORDER — CLONIDINE HCL 0.1 MG PO TABS
ORAL_TABLET | ORAL | Status: AC
  Administered 2014-12-26: 0.1 mg via ORAL
  Filled 2014-12-26: qty 1

## 2014-12-26 MED ORDER — CLONIDINE HCL 0.1 MG PO TABS
0.1000 mg | ORAL_TABLET | Freq: Once | ORAL | Status: AC
  Administered 2014-12-26: 0.1 mg via ORAL

## 2014-12-26 NOTE — ED Notes (Signed)
pt report hiatal hernia and when she eats it elevates her BP pt report epigastric pressure after eating in the evening

## 2014-12-26 NOTE — Telephone Encounter (Signed)
Patient called in and states that she has been going back and forth to Emergency Room with high Blood Pressure due to her anxiety. She would like to know if this is an absolutely needed test for her surgery and if she can get in any earlier.  Informed patient that this is necessary for surgery.  Called Endoscopy at this time. Spoke with Trish. She informed me that their hasn't been a cancellation but that she would call if a patient cancels in order to get patient in earlier for this test.

## 2014-12-27 ENCOUNTER — Emergency Department: Payer: 59

## 2014-12-27 ENCOUNTER — Ambulatory Visit: Payer: 59 | Admitting: Internal Medicine

## 2014-12-27 LAB — URINALYSIS COMPLETE WITH MICROSCOPIC (ARMC ONLY)
Bilirubin Urine: NEGATIVE
Glucose, UA: NEGATIVE mg/dL
KETONES UR: NEGATIVE mg/dL
LEUKOCYTES UA: NEGATIVE
NITRITE: NEGATIVE
PH: 7 (ref 5.0–8.0)
PROTEIN: NEGATIVE mg/dL
SPECIFIC GRAVITY, URINE: 1.003 — AB (ref 1.005–1.030)

## 2014-12-27 MED ORDER — IOHEXOL 240 MG/ML SOLN
25.0000 mL | Freq: Once | INTRAMUSCULAR | Status: AC | PRN
  Administered 2014-12-27: 25 mL via ORAL

## 2014-12-27 MED ORDER — IOHEXOL 300 MG/ML  SOLN
100.0000 mL | Freq: Once | INTRAMUSCULAR | Status: AC | PRN
  Administered 2014-12-27: 100 mL via INTRAVENOUS

## 2014-12-27 NOTE — Discharge Instructions (Signed)
Hypertension Hypertension, commonly called high blood pressure, is when the force of blood pumping through your arteries is too strong. Your arteries are the blood vessels that carry blood from your heart throughout your body. A blood pressure reading consists of a higher number over a lower number, such as 110/72. The higher number (systolic) is the pressure inside your arteries when your heart pumps. The lower number (diastolic) is the pressure inside your arteries when your heart relaxes. Ideally you want your blood pressure below 120/80. Hypertension forces your heart to work harder to pump blood. Your arteries may become narrow or stiff. Having untreated or uncontrolled hypertension can cause heart attack, stroke, kidney disease, and other problems. RISK FACTORS Some risk factors for high blood pressure are controllable. Others are not.  Risk factors you cannot control include:   Race. You may be at higher risk if you are African American.  Age. Risk increases with age.  Gender. Men are at higher risk than women before age 45 years. After age 65, women are at higher risk than men. Risk factors you can control include:  Not getting enough exercise or physical activity.  Being overweight.  Getting too much fat, sugar, calories, or salt in your diet.  Drinking too much alcohol. SIGNS AND SYMPTOMS Hypertension does not usually cause signs or symptoms. Extremely high blood pressure (hypertensive crisis) may cause headache, anxiety, shortness of breath, and nosebleed. DIAGNOSIS To check if you have hypertension, your health care provider will measure your blood pressure while you are seated, with your arm held at the level of your heart. It should be measured at least twice using the same arm. Certain conditions can cause a difference in blood pressure between your right and left arms. A blood pressure reading that is higher than normal on one occasion does not mean that you need treatment. If  it is not clear whether you have high blood pressure, you may be asked to return on a different day to have your blood pressure checked again. Or, you may be asked to monitor your blood pressure at home for 1 or more weeks. TREATMENT Treating high blood pressure includes making lifestyle changes and possibly taking medicine. Living a healthy lifestyle can help lower high blood pressure. You may need to change some of your habits. Lifestyle changes may include:  Following the DASH diet. This diet is high in fruits, vegetables, and whole grains. It is low in salt, red meat, and added sugars.  Keep your sodium intake below 2,300 mg per day.  Getting at least 30-45 minutes of aerobic exercise at least 4 times per week.  Losing weight if necessary.  Not smoking.  Limiting alcoholic beverages.  Learning ways to reduce stress. Your health care provider may prescribe medicine if lifestyle changes are not enough to get your blood pressure under control, and if one of the following is true:  You are 18-59 years of age and your systolic blood pressure is above 140.  You are 60 years of age or older, and your systolic blood pressure is above 150.  Your diastolic blood pressure is above 90.  You have diabetes, and your systolic blood pressure is over 140 or your diastolic blood pressure is over 90.  You have kidney disease and your blood pressure is above 140/90.  You have heart disease and your blood pressure is above 140/90. Your personal target blood pressure may vary depending on your medical conditions, your age, and other factors. HOME CARE INSTRUCTIONS    Have your blood pressure rechecked as directed by your health care provider.   Take medicines only as directed by your health care provider. Follow the directions carefully. Blood pressure medicines must be taken as prescribed. The medicine does not work as well when you skip doses. Skipping doses also puts you at risk for  problems.  Do not smoke.   Monitor your blood pressure at home as directed by your health care provider. SEEK MEDICAL CARE IF:   You think you are having a reaction to medicines taken.  You have recurrent headaches or feel dizzy.  You have swelling in your ankles.  You have trouble with your vision. SEEK IMMEDIATE MEDICAL CARE IF:  You develop a severe headache or confusion.  You have unusual weakness, numbness, or feel faint.  You have severe chest or abdominal pain.  You vomit repeatedly.  You have trouble breathing. MAKE SURE YOU:   Understand these instructions.  Will watch your condition.  Will get help right away if you are not doing well or get worse.   This information is not intended to replace advice given to you by your health care provider. Make sure you discuss any questions you have with your health care provider.   Document Released: 01/05/2005 Document Revised: 05/22/2014 Document Reviewed: 10/28/2012 Elsevier Interactive Patient Education 2016 [employer]vier [employer]  Abdominal Pain, Adult Many things can cause abdominal pain. Usually, abdominal pain is not caused by a disease and will improve without treatment. It can often be observed and treated at home. Your health care provider will do a physical exam and possibly order blood tests and X-rays to help determine the seriousness of your pain. However, in many cases, more time must pass before a clear cause of the pain can be found. Before that point, your health care provider may not know if you need more testing or further treatment. HOME CARE INSTRUCTIONS Monitor your abdominal pain for any changes. The following actions may help to alleviate any discomfort you are experiencing:  Only take over-the-counter or prescription medicines as directed by your health care provider.  Do not take laxatives unless directed to do so by your health care provider.  Try a clear liquid diet (broth, tea, or water) as  directed by your health care provider. Slowly move to a bland diet as tolerated. SEEK MEDICAL CARE IF:  You have unexplained abdominal pain.  You have abdominal pain associated with nausea or diarrhea.  You have pain when you urinate or have a bowel movement.  You experience abdominal pain that wakes you in the night.  You have abdominal pain that is worsened or improved by eating food.  You have abdominal pain that is worsened with eating fatty foods.  You have a fever. SEEK IMMEDIATE MEDICAL CARE IF:  Your pain does not go away within 2 hours.  You keep throwing up (vomiting).  Your pain is felt only in portions of the abdomen, such as the right side or the left lower portion of the abdomen.  You pass bloody or black tarry stools. MAKE SURE YOU:  Understand these instructions.  Will watch your condition.  Will get help right away if you are not doing well or get worse.   This information is not intended to replace advice given to you by your health care provider. Make sure you discuss any questions you have with your health care provider.   Document Released: 10/15/2004 Document Revised: 09/26/2014 Document Reviewed: 09/14/2012 Elsevier Interactive Patient Education 2016  Elsevier Inc. ° °

## 2014-12-27 NOTE — ED Provider Notes (Signed)
[facility] Emergency Department Provider Note  ____________________________________________  Time seen: 11:30 PM  I have reviewed the triage vital signs and the nursing notes.   HISTORY  Chief Complaint Hypertension      HPI Angela Griffith is a 54 y.o. female presents with epigastric pain and pressure after eating this evening with accompanying hypertension patient states her systolic blood pressure was greater than 200 before presentation to the emergency department. Patient stated when she noted that her pressure was elevated she took an additional amlodipine. On her arrival to the emergency department her blood pressures was 156/94. Currently at bedside patient's blood pressure is 190/99. Of note patient states that she has a history of a hiatal hernia. Of note patient was recently seen for similar complaint on 12/25/2014. Review of the patient's endoscopy HIDA scan and other modalities performed revealed no evidence of hiatal hernia    Past Medical History  Diagnosis Date  . GERD (gastroesophageal reflux disease)   . Colon polyps     last colonoscopy in 2015 nml  . Menstrual migraine   . Hypertension   . Diabetes mellitus without complication [facility](HCC)     Patient denies    Patient Active Problem List   Diagnosis Date Noted  . Hiatal hernia 11/29/2014  . Hyperlipidemia associated with type 2 diabetes mellitus (HCC) 11/29/2014  . Abdominal pain, epigastric   . Heartburn   . Non-intractable cyclical vomiting with nausea   . Gastritis   . Obesity, Class I, BMI 30-34.9 11/08/2014  . HBP (high blood pressure) 10/26/2014  . GERD without esophagitis 08/31/2014  . Diabetes mellitus without complication (HCC) 08/31/2014    Past Surgical History  Procedure Laterality Date  . Exploratory laparotomy  1993    for endometriosis  . Esophagogastroduodenoscopy (egd) with propofol N/A 11/19/2014    Procedure: ESOPHAGOGASTRODUODENOSCOPY (EGD) WITH  PROPOFOL;  Surgeon: Midge Miniumarren Wohl, MD;  [facility];  Service: Endoscopy;  Laterality: N/A;  . Cesarean section  1985 and 1988  . Abdominal hysterectomy  1996    Current Outpatient Rx  Name  Route  Sig  Dispense  Refill  . amLODipine (NORVASC) 2.5 MG tablet   Oral   Take 1 tablet (2.5 mg total) by mouth daily.   30 tablet   3   . calcium carbonate (TUMS - DOSED IN MG ELEMENTAL CALCIUM) 500 MG chewable tablet   Oral   Chew 1 tablet by mouth as needed for indigestion or heartburn.         . dexlansoprazole (DEXILANT) 60 MG capsule   Oral   Take 1 capsule (60 mg total) by mouth daily.   30 capsule   11   . lisinopril (PRINIVIL,ZESTRIL) 10 MG tablet   Oral   Take 1 tablet (10 mg total) by mouth daily.   30 tablet   11   . ondansetron (ZOFRAN) 4 MG tablet   Oral   Take 1 tablet (4 mg total) by mouth daily as needed for nausea or vomiting.   10 tablet   1   . pantoprazole (PROTONIX) 40 MG tablet   Oral   Take 1 tablet (40 mg total) by mouth daily.   30 tablet   1   . ranitidine (ZANTAC) 150 MG tablet   Oral   Take 150 mg by mouth 2 (two) times daily.         . potassium chloride (K-DUR) 10 MEQ tablet   Oral   Take 2 tablets (  20 mEq total) by mouth daily. Patient not taking: Reported on 12/25/2014   5 tablet   0     Allergies Sulfa antibiotics; Theophyllines; Codeine; Hydrochlorothiazide; and Demerol  Family History  Problem Relation Age of Onset  . Deep vein thrombosis Father   . Cancer Father     colon  . Alcohol abuse Father   . Hypertension Father   . Anuerysm Maternal Grandmother   . Diabetes Mother   . Stroke Mother   . Hypertension Mother   . Diabetes Maternal Aunt   . Diabetes Maternal Uncle     Social History Social History  Substance Use Topics  . Smoking status: Former Smoker -- 0.25 packs/day for 34 years    Types: Cigarettes    Quit date: 01/19/2014  . Smokeless tobacco: Never Used  . Alcohol Use: No    Review  of Systems  Constitutional: Negative for fever. Eyes: Negative for visual changes. ENT: Negative for sore throat. Cardiovascular: Negative for chest pain. Respiratory: Negative for shortness of breath. Gastrointestinal: Negative for, vomiting and diarrhea. Positive for epigastric pain Genitourinary: Negative for dysuria. Musculoskeletal: Negative for back pain. Skin: Negative for rash. Neurological: Negative for headaches, focal weakness or numbness.   10-point ROS otherwise negative.  ____________________________________________   PHYSICAL EXAM:  VITAL SIGNS: ED Triage Vitals  Enc Vitals Group     BP 12/26/14 2257 160/103 mmHg     Pulse Rate 12/26/14 2257 66     Resp 12/26/14 2257 18     Temp 12/26/14 2257 98.8 F (37.1 C)     Temp Source 12/26/14 2257 Oral     SpO2 12/26/14 2257 99 %     Weight 12/26/14 2249 190 lb (86.183 kg)     Height 12/26/14 2249  (1.651 m)     Head Cir --      Peak Flow --      Pain Score 12/26/14 2249 7     Pain Loc --      Pain Edu? --      Excl. in GC? --     Constitutional: Alert and oriented. Well appearing and in no distress. Eyes: Conjunctivae are normal. PERRL. Normal extraocular movements. ENT   Head: Normocephalic and atraumatic.   Nose: No congestion/rhinnorhea.   Mouth/Throat: Mucous membranes are moist.   Neck: No stridor. Hematological/Lymphatic/Immunilogical: No cervical lymphadenopathy. Cardiovascular: Normal rate, regular rhythm. Normal and symmetric distal pulses are present in all extremities. No murmurs, rubs, or gallops. Respiratory: Normal respiratory effort without tachypnea nor retractions. Breath sounds are clear and equal bilaterally. No wheezes/rales/rhonchi. Gastrointestinal: Epigastric discomfort with palpation No distention. There is no CVA tenderness. Genitourinary: deferred Musculoskeletal: Nontender with normal range of motion in all extremities. No joint effusions.  No lower extremity  tenderness nor edema. Neurologic:  Normal speech and language. No gross focal neurologic deficits are appreciated. Speech is normal.  Skin:  Skin is warm, dry and intact. No rash noted. Psychiatric: Mood and affect are normal. Speech and behavior are normal. Patient exhibits appropriate insight and judgment.  ____________________________________________    LABS (pertinent positives/negatives)  Labs Reviewed  COMPREHENSIVE METABOLIC PANEL - Abnormal; Notable for the following:    Glucose, Bld 143 (*)    AST 14 (*)    ALT 13 (*)    All other components within normal limits  CBC - Abnormal; Notable for the following:    WBC 13.1 (*)    Hemoglobin 11.7 (*)    RDW 14.8 (*)  All other components within normal limits  URINALYSIS COMPLETEWITH MICROSCOPIC ([facility] ONLY) - Abnormal; Notable for the following:    Color, Urine COLORLESS (*)    APPearance CLEAR (*)    Specific Gravity, Urine 1.003 (*)    Hgb urine dipstick 1+ (*)    Bacteria, UA MANY (*)    Squamous Epithelial / LPF 0-5 (*)    All other components within normal limits  LIPASE, BLOOD     ____________________________________________   EKG  ED ECG REPORT I, Ileana Chalupa, Quinby N, the attending physician, personally viewed and interpreted this ECG.   Date: 12/27/2014  EKG Time: 10:54 PM  Rate: 69  Rhythm: Normal sinus rhythm  Axis: None  Intervals: Normal  ST&T Change: None   ____________________________________________    RADIOLOGY CT Abdomen Pelvis W Contrast (Final result) Result time: 12/27/14 01:29:12   Final result by Rad Results In Interface (12/27/14 01:29:12)   Narrative:   CLINICAL DATA: Acute onset of epigastric pressure. High blood pressure. Initial encounter.  EXAM: CT ABDOMEN AND PELVIS WITH CONTRAST  TECHNIQUE: Multidetector CT imaging of the abdomen and pelvis was performed using the standard protocol following bolus administration of intravenous contrast.  CONTRAST: OMNIPAQUE  IOHEXOL 300 MG/ML SOLN  COMPARISON: Right upper quadrant ultrasound performed 10/24/2014  FINDINGS: The visualized lung bases are clear. No hiatal hernia is seen.  The liver and spleen are unremarkable in appearance. The gallbladder is within normal limits. The pancreas and adrenal glands are unremarkable.  The kidneys are unremarkable in appearance. There is no evidence of hydronephrosis. No renal or ureteral stones are seen. No perinephric stranding is appreciated.  No free fluid is identified. The small bowel is unremarkable in appearance. The stomach is within normal limits. No acute vascular abnormalities are seen. Minimal calcification is noted along the distal abdominal aorta and its branches.  The appendix is normal in caliber and contains air, without evidence of appendicitis. The colon is unremarkable in appearance.  The bladder is mildly distended and grossly unremarkable. The patient is status post hysterectomy. No suspicious adnexal masses are seen. No inguinal lymphadenopathy is seen.  No acute osseous abnormalities are identified. Vacuum phenomenon is noted at L5-S1.  IMPRESSION: No acute abnormality seen within the abdomen or pelvis. No hiatal hernia seen.   Electronically Signed By: Roanna Raider M.D. On: 12/27/2014 01:29           INITIAL IMPRESSION / ASSESSMENT AND PLAN / ED COURSE  Pertinent labs & imaging results that were available during my care of the patient were reviewed by me and considered in my medical decision making (see chart for details).  Given leukocytosis epigastric abdominal pain without clear etiology noted on previous imaging modalities. Patient's hypertension with throbbing-like epigastric pain CT scan of the abdomen was performed which revealed no gross abnormality per radiologist  ____________________________________________   FINAL CLINICAL IMPRESSION(S) / ED DIAGNOSES  Final diagnoses:  Essential  hypertension  Epigastric pain      Darci Current, MD 12/28/14 [phone]

## 2014-12-28 ENCOUNTER — Telehealth: Payer: Self-pay | Admitting: Family Medicine

## 2014-12-28 ENCOUNTER — Ambulatory Visit: Payer: Self-pay | Admitting: Physician Assistant

## 2014-12-28 ENCOUNTER — Encounter: Payer: Self-pay | Admitting: Physician Assistant

## 2014-12-28 VITALS — BP 130/82 | HR 76 | Temp 98.2°F

## 2014-12-28 DIAGNOSIS — I10 Essential (primary) hypertension: Secondary | ICD-10-CM

## 2014-12-28 DIAGNOSIS — J018 Other acute sinusitis: Secondary | ICD-10-CM

## 2014-12-28 MED ORDER — FLUTICASONE PROPIONATE 50 MCG/ACT NA SUSP: 2.0000 | Freq: Every day | NASAL | Status: AC

## 2014-12-28 MED ORDER — AMOXICILLIN 875 MG PO TABS: 875.0000 mg | ORAL_TABLET | Freq: Two times a day (BID) | ORAL | Status: DC

## 2014-12-28 NOTE — Progress Notes (Signed)
S: C/o runny nose and congestion for 7 days, no fever, chills, cp/sob, v/d; mucus is yellow and thick, throat is scratchy and sore, c/o of facial and dental pain.   Using otc meds:   O: PE: vitals wnl, nad, perrl eomi, normocephalic, tms dull, nasal mucosa red and swollen, throat injected, neck supple no lymph, lungs c t a, cv rrr, neuro intact  A:  Acute sinusitis   P: amoxil 875mg  bid x 10d, flonase; drink fluids, continue regular meds , use otc meds of choice, return if not improving in 5 days, return earlier if worsening

## 2014-12-28 NOTE — Telephone Encounter (Signed)
Pt called regarding BP.  She was in the ER again. Pt advised to take BP medication in the evenings. Take 10 mg of lisinopril daily and 2.5mg  of amlodipine daily. She can take and additional amlodipine if BP becomes elevated. Due to fluctuating BP we will have her evaluated by vascular to r/o renal artery stenosis.

## 2014-12-29 ENCOUNTER — Encounter: Payer: Self-pay | Admitting: Emergency Medicine

## 2014-12-29 ENCOUNTER — Emergency Department
Admission: EM | Admit: 2014-12-29 | Discharge: 2014-12-30 | Disposition: A | Discharge status: Home / Self Care | Payer: 59 | Attending: Emergency Medicine | Admitting: Emergency Medicine

## 2014-12-29 DIAGNOSIS — Z792 Long term (current) use of antibiotics: Secondary | ICD-10-CM | POA: Diagnosis not present

## 2014-12-29 DIAGNOSIS — Z87891 Personal history of nicotine dependence: Secondary | ICD-10-CM | POA: Diagnosis not present

## 2014-12-29 DIAGNOSIS — I10 Essential (primary) hypertension: Secondary | ICD-10-CM | POA: Insufficient documentation

## 2014-12-29 DIAGNOSIS — R51 Headache: Secondary | ICD-10-CM | POA: Insufficient documentation

## 2014-12-29 DIAGNOSIS — E119 Type 2 diabetes mellitus without complications: Secondary | ICD-10-CM | POA: Diagnosis not present

## 2014-12-29 DIAGNOSIS — Z79899 Other long term (current) drug therapy: Secondary | ICD-10-CM | POA: Diagnosis not present

## 2014-12-29 HISTORY — DX: Diaphragmatic hernia without obstruction or gangrene: K44.9

## 2014-12-29 NOTE — ED Notes (Signed)
Pt states has had high blood pressure "all day", pt states recent change in blood pressure medication. Pt states has headache, pt states blood pressure have been 200/120 at home. Pt states has had nausea, denies shob.

## 2014-12-30 NOTE — Discharge Instructions (Signed)

## 2014-12-30 NOTE — ED Provider Notes (Signed)
[facility] Emergency Department Provider Note  ____________________________________________  Time seen: Approximately 1:27 AM  I have reviewed the triage vital signs and the nursing notes.   HISTORY  Chief Complaint Hypertension    HPI TRANICE Griffith is a 54 y.o. female with frequent visits to the emergency department for variety of complaints.  She comes in today concerned about her blood pressure running high.  She keeps a careful log every few hours and states that her blood pressure at home as been running in the 200s.  However since coming to the emergency department his been in the 150s to 180 systolic which is consistent with prior values.  Occasionally she has had some nausea which is typical for her and her history of a hiatal hernia.  She also occasionally has a headache but currently is gone.  She denies chest pain, shortness of breath, vomiting, dysuria, visual changes.  She is concerned about the severity of her blood pressure and if it is consistently in the 200s that would be considered severe, but at this time as mild to moderate at worst.  Nothing makes it better and nothing makes it worse.  She currently takes lisinopril and milligrams twice daily and amlodipine 2.5 mg twice daily.   Past Medical History  Diagnosis Date  . GERD (gastroesophageal reflux disease)   . Colon polyps     last colonoscopy in 2015 nml  . Menstrual migraine   . Hypertension   . Hiatal hernia     Patient Active Problem List   Diagnosis Date Noted  . Hiatal hernia 11/29/2014  . Hyperlipidemia associated with type 2 diabetes mellitus (HCC) 11/29/2014  . Abdominal pain, epigastric   . Heartburn   . Non-intractable cyclical vomiting with nausea   . Gastritis   . Obesity, Class I, BMI 30-34.9 11/08/2014  . HBP (high blood pressure) 10/26/2014  . GERD without esophagitis 08/31/2014  . Diabetes mellitus without complication (HCC) 08/31/2014    Past Surgical  History  Procedure Laterality Date  . Exploratory laparotomy  1993    for endometriosis  . Esophagogastroduodenoscopy (egd) with propofol N/A 11/19/2014    Procedure: ESOPHAGOGASTRODUODENOSCOPY (EGD) WITH PROPOFOL;  Surgeon: [name], MD;  Location: [facility];  Service: Endoscopy;  Laterality: N/A;  . Cesarean section  1985 and 1988  . Abdominal hysterectomy  1996  . Endometrial surgery      Current Outpatient Rx  Name  Route  Sig  Dispense  Refill  . amLODipine (NORVASC) 2.5 MG tablet   Oral   Take 1 tablet (2.5 mg total) by mouth daily.   30 tablet   3   . amoxicillin (AMOXIL) 875 MG tablet   Oral   Take 1 tablet (875 mg total) by mouth 2 (two) times daily.   20 tablet   0   . calcium carbonate (TUMS - DOSED IN MG ELEMENTAL CALCIUM) 500 MG chewable tablet   Oral   Chew 1 tablet by mouth as needed for indigestion or heartburn.         . dexlansoprazole (DEXILANT) 60 MG capsule   Oral   Take 1 capsule (60 mg total) by mouth daily.   30 capsule   11   . fluticasone (FLONASE) 50 MCG/ACT nasal spray   Each Nare   Place 2 sprays into both nostrils daily.   16 g   6   . lisinopril (PRINIVIL,ZESTRIL) 10 MG tablet   Oral   Take 1 tablet (10  mg total) by mouth daily.   30 tablet   11   . ondansetron (ZOFRAN) 4 MG tablet   Oral   Take 1 tablet (4 mg total) by mouth daily as needed for nausea or vomiting.   10 tablet   1   . ranitidine (ZANTAC) 150 MG tablet   Oral   Take 150 mg by mouth 2 (two) times daily.           Allergies Sulfa antibiotics; Theophyllines; Codeine; Hydrochlorothiazide; and Demerol  Family History  Problem Relation Age of Onset  . Deep vein thrombosis Father   . Cancer Father     colon  . Alcohol abuse Father   . Hypertension Father   . Anuerysm Maternal Grandmother   . Diabetes Mother   . Stroke Mother   . Hypertension Mother   . Diabetes Maternal Aunt   . Diabetes Maternal Uncle     Social History Social  History  Substance Use Topics  . Smoking status: Former Smoker -- 0.25 packs/day for 34 years    Types: Cigarettes    Quit date: 01/19/2014  . Smokeless tobacco: Never Used  . Alcohol Use: No    Review of Systems Constitutional: No fever/chills Eyes: No visual changes. ENT: No sore throat. Cardiovascular: Denies chest pain. Respiratory: Denies shortness of breath. Gastrointestinal: No abdominal pain.  nausea, no vomiting.  No diarrhea.  No constipation. Genitourinary: Negative for dysuria. Musculoskeletal: Negative for back pain. Skin: Negative for rash. Neurological: Facial mild headache with no focal weakness nor numbness.  10-point ROS otherwise negative.  ____________________________________________   PHYSICAL EXAM:  VITAL SIGNS: ED Triage Vitals  Enc Vitals Group     BP 12/29/14 2244 182/94 mmHg     Pulse Rate 12/29/14 2244 72     Resp 12/29/14 2244 14     Temp 12/29/14 2244 98.4 F (36.9 C)     Temp Source 12/29/14 2244 Oral     SpO2 12/29/14 2244 100 %     Weight 12/29/14 2244 192 lb (87.091 kg)     Height 12/29/14 2244 5\' 5"  (1.651 m)     Head Cir --      Peak Flow --      Pain Score 12/29/14 2245 4     Pain Loc --      Pain Edu? --      Excl. in GC? --     Constitutional: Alert and oriented. Well appearing and in no acute distress. Eyes: Conjunctivae are normal. PERRL. EOMI. Head: Atraumatic. Nose: No congestion/rhinnorhea. Mouth/Throat: Mucous membranes are moist.  Oropharynx non-erythematous. Neck: No stridor.   Cardiovascular: Normal rate, regular rhythm. Grossly normal heart sounds.  Good peripheral circulation. Respiratory: Normal respiratory effort.  No retractions. Lungs CTAB. Gastrointestinal: Soft and nontender. No distention. No abdominal bruits. No CVA tenderness. Musculoskeletal: No lower extremity tenderness nor edema.  No joint effusions. Neurologic:  Normal speech and language. No gross focal neurologic deficits are appreciated.   Skin:  Skin is warm, dry and intact. No rash noted. Psychiatric: Mood and affect are normal. Speech and behavior are normal.  ____________________________________________   LABS (all labs ordered are listed, but only abnormal results are displayed)  Labs Reviewed - No data to display ____________________________________________  EKG  Not indicated ____________________________________________  RADIOLOGY   No results found.  ____________________________________________   PROCEDURES  Procedure(s) performed: None  Critical Care performed: No ____________________________________________   INITIAL IMPRESSION / ASSESSMENT AND PLAN / ED COURSE  Pertinent labs &  imaging results that were available during my care of the patient were reviewed by me and considered in my medical decision making (see chart for details).  Patient has hypertension but not anything that requires intervention.  I had an extensive discussion with her about the risks versus benefits of changing her medication when we are seeing her in the emergency department.  She is just frustrated with the values that she is sitting at home but understands she needs to follow up with her regular doctor.  I explained that if she absolutely has to do something for consistently high blood pressure she could increase her amlodipine to 5 mg daily (at 1 dose in the morning), but I prefer that she see her primary before making any changes.  She understands and agrees.  I gave my usual and customary return precautions.     ____________________________________________  FINAL CLINICAL IMPRESSION(S) / ED DIAGNOSES  Final diagnoses:  Essential hypertension      NEW MEDICATIONS STARTED DURING THIS VISIT:  New Prescriptions   No medications on file     Sherrod Toothman ForbLoleta Rose2/11/16 [phone]

## 2014-12-31 ENCOUNTER — Ambulatory Visit: Payer: 59

## 2014-12-31 ENCOUNTER — Ambulatory Visit (INDEPENDENT_AMBULATORY_CARE_PROVIDER_SITE_OTHER): Payer: 59 | Admitting: Family Medicine

## 2014-12-31 ENCOUNTER — Encounter: Payer: Self-pay | Admitting: Family Medicine

## 2014-12-31 VITALS — BP 126/72 | HR 81 | Temp 98.4°F | Resp 16 | Ht 65.0 in | Wt 189.0 lb

## 2014-12-31 DIAGNOSIS — I10 Essential (primary) hypertension: Secondary | ICD-10-CM | POA: Diagnosis not present

## 2014-12-31 MED ORDER — LISINOPRIL 20 MG PO TABS: 20.0000 mg | ORAL_TABLET | Freq: Every day | ORAL | Status: DC

## 2014-12-31 MED ORDER — AMLODIPINE BESYLATE 5 MG PO TABS: 5.0000 mg | ORAL_TABLET | Freq: Every day | ORAL | Status: DC

## 2014-12-31 NOTE — Progress Notes (Signed)
Subjective:    Patient ID: Angela Griffith, female    DOB: 10-Jun-1960, 54 y.o.   MRN: [id number]  HPI: Angela Griffith is a 54 y.o. female presenting on 12/31/2014 for Hospitalization Follow-up   HPI  Pt presents for hypertension follow-up after an ER visit. Her BP got to 200's yesterday evening. She has been taking 10 of lisinopril and 2.5mg  of amlodipine.  She was instructed to take 5 of amlodipine if her BP became elevated. Pt is concerned about her BP being too low and is reluctant to take too much medication.  When BP is elevated she experiences nausea and HA.  She also has a history or a hiatal hernia and BP gets up when experience acid reflux symptoms.  She has been to the ER several times for fluctuations in BP. She missed a cardiology appt on 12/8.  She has an appt to see vein and vascular on 12/13.  Past Medical History  Diagnosis Date  . GERD (gastroesophageal reflux disease)   . Colon polyps     last colonoscopy in 2015 nml  . Menstrual migraine   . Hypertension   . Hiatal hernia     Current Outpatient Prescriptions on File Prior to Visit  Medication Sig  . amoxicillin (AMOXIL) 875 MG tablet Take 1 tablet (875 mg total) by mouth 2 (two) times daily.  . calcium carbonate (TUMS - DOSED IN MG ELEMENTAL CALCIUM) 500 MG chewable tablet Chew 1 tablet by mouth as needed for indigestion or heartburn.  . dexlansoprazole (DEXILANT) 60 MG capsule Take 1 capsule (60 mg total) by mouth daily.  . fluticasone (FLONASE) 50 MCG/ACT nasal spray Place 2 sprays into both nostrils daily.  . ondansetron (ZOFRAN) 4 MG tablet Take 1 tablet (4 mg total) by mouth daily as needed for nausea or vomiting.  . ranitidine (ZANTAC) 150 MG tablet Take 150 mg by mouth 2 (two) times daily.   No current facility-administered medications on file prior to visit.    Review of Systems  Constitutional: Negative for fever and chills.  HENT: Negative.   Respiratory: Negative for cough, chest  tightness and wheezing.   Cardiovascular: Negative for chest pain and leg swelling.  Gastrointestinal: Negative for nausea, vomiting, abdominal pain, diarrhea and constipation.  Endocrine: Negative.  Negative for cold intolerance, heat intolerance, polydipsia, polyphagia and polyuria.  Genitourinary: Negative for dysuria and difficulty urinating.  Musculoskeletal: Negative.   Neurological: Negative for dizziness, light-headedness and numbness.  Psychiatric/Behavioral: Negative.    Per HPI unless specifically indicated above     Objective:    BP 131/93 mmHg  Pulse 81  Temp(Src) 98.4 F (36.9 C) (Oral)  Resp 16  Ht  (1.651 m)  Wt 189 lb (85.73 kg)  BMI 31.45 kg/m2  Wt Readings from Last 3 Encounters:  12/31/14 189 lb (85.73 kg)  12/29/14 192 lb (87.091 kg)  12/26/14 190 lb (86.183 kg)    Physical Exam  Constitutional: She is oriented to person, place, and time. She appears well-developed and well-nourished.  HENT:  Head: Normocephalic and atraumatic.  Neck: Neck supple.  Cardiovascular: Normal rate, regular rhythm and normal heart sounds.  Exam reveals no gallop and no friction rub.   No murmur heard. Pulmonary/Chest: Effort normal and breath sounds normal. She has no wheezes. She exhibits no tenderness.  Abdominal: Soft. Normal appearance and bowel sounds are normal. She exhibits no distension and no mass. There is no tenderness. There is no rebound and no guarding.  Musculoskeletal:  Normal range of motion. She exhibits no edema or tenderness.  Lymphadenopathy:    She has no cervical adenopathy.  Neurological: She is alert and oriented to person, place, and time.  Skin: Skin is warm and dry.   Results for orders placed or performed during the hospital encounter of 12/26/14  Lipase, blood  Result Value Ref Range   Lipase 28 11 - 51 U/L  Comprehensive metabolic panel  Result Value Ref Range   Sodium 139 135 - 145 mmol/L   Potassium 3.9 3.5 - 5.1 mmol/L   Chloride  108 101 - 111 mmol/L   CO2 26 22 - 32 mmol/L   Glucose, Bld 143 (H) 65 - 99 mg/dL   BUN 12 6 - 20 mg/dL   Creatinine, Ser 1.610.82 0.44 - 1.00 mg/dL   Calcium 9.2 8.9 - 09.610.3 mg/dL   Total Protein 7.1 6.5 - 8.1 g/dL   Albumin 3.7 3.5 - 5.0 g/dL   AST 14 (L) 15 - 41 U/L   ALT 13 (L) 14 - 54 U/L   Alkaline Phosphatase 57 38 - 126 U/L   Total Bilirubin 0.5 0.3 - 1.2 mg/dL   GFR calc non Af Amer >60 >60 mL/min   GFR calc Af Amer >60 >60 mL/min   Anion gap 5 5 - 15  CBC  Result Value Ref Range   WBC 13.1 (H) 3.6 - 11.0 K/uL   RBC 4.43 3.80 - 5.20 MIL/uL   Hemoglobin 11.7 (L) 12.0 - 16.0 g/dL   HCT 04.536.0 40.935.0 - 81.147.0 %   MCV 81.2 80.0 - 100.0 fL   MCH 26.4 26.0 - 34.0 pg   MCHC 32.5 32.0 - 36.0 g/dL   RDW 91.414.8 (H) 78.211.5 - 95.614.5 %   Platelets 335 150 - 440 K/uL  Urinalysis complete, with microscopic ([facility] only)  Result Value Ref Range   Color, Urine COLORLESS (A) YELLOW   APPearance CLEAR (A) CLEAR   Glucose, UA NEGATIVE NEGATIVE mg/dL   Bilirubin Urine NEGATIVE NEGATIVE   Ketones, ur NEGATIVE NEGATIVE mg/dL   Specific Gravity, Urine 1.003 (L) 1.005 - 1.030   Hgb urine dipstick 1+ (A) NEGATIVE   pH 7.0 5.0 - 8.0   Protein, ur NEGATIVE NEGATIVE mg/dL   Nitrite NEGATIVE NEGATIVE   Leukocytes, UA NEGATIVE NEGATIVE   RBC / HPF 0-5 0 - 5 RBC/hpf   WBC, UA 0-5 0 - 5 WBC/hpf   Bacteria, UA MANY (A) NONE SEEN   Squamous Epithelial / LPF 0-5 (A) NONE SEEN      Assessment & Plan:   Problem List Items Addressed This Visit      Cardiovascular and Mediastinum   Uncontrolled hypertension - Primary   Relevant Medications   lisinopril (PRINIVIL,ZESTRIL) 20 MG tablet   amLODipine (NORVASC) 5 MG tablet   Other Relevant Orders   Aldosterone + renin activity w/o ratio    Other Visit Diagnoses    Hypokalemia        Recheck BMP. HCTZ discontinued to prevent further potassium issues.        Meds ordered this encounter  Medications  . lisinopril (PRINIVIL,ZESTRIL) 20 MG tablet    Sig: Take 1  tablet (20 mg total) by mouth daily.    Dispense:  30 tablet    Refill:  11    Order Specific Question:  Supervising Provider    Answer:  Janeann ForehandHAWKINS JR, JAMES H [phone][[id number]]  . amLODipine (NORVASC) 5 MG tablet    Sig: Take 1 tablet (  5 mg total) by mouth daily.    Dispense:  30 tablet    Refill:  11    Order Specific Question:  Supervising Provider    Answer:  [name] [phone]      Follow up plan: No Follow-up on file.

## 2014-12-31 NOTE — Assessment & Plan Note (Addendum)
Major BP fluctuations. Trial of AM lisinopril and PM amlodipine to control BP.  Pt to hold BP medication is < 100/60. Strict instructions to call provider with questions.  Suspect pt self titration of BP medications is leading to fluctuations- however r/o vascular causes and primary hyperaldosteronism. Referred to vein and vascular to r/o renal artery stenosis.  Check renin/aldosterone levels. Recheck BP one week.  I spent >20 minutes face to face counseling this patient regarding management of her hypertension.

## 2014-12-31 NOTE — Patient Instructions (Signed)
Hypertension:  I want you to take 20mg  of lisinopril in the AM and 5mg  of amlodipine in the afternoon (~6pm). We will check some labs to determine if something is causing your blood pressure fluctuations.  You have an Appt tomorrow on 01/01/15 830am.  Check your BP once daily in the morning: If it is less than 100/60 in the morning, DO NOT take lisinopril. Just take amlodipine in the afternoon if BP increases.   Please call me if you feel dizzy when taking BP medication.

## 2015-01-07 ENCOUNTER — Telehealth: Payer: Self-pay | Admitting: General Surgery

## 2015-01-07 NOTE — Telephone Encounter (Signed)
Phone number to call during the day is [phone] OR after 5:00 [phone]

## 2015-01-07 NOTE — Telephone Encounter (Signed)
Patient called just to see when manometry had been scheduled, i did let patient know i saw in chart the message where there wasn't any openings to get her in soon she was just following up

## 2015-01-08 ENCOUNTER — Telehealth: Payer: Self-pay | Admitting: Family Medicine

## 2015-01-08 NOTE — Telephone Encounter (Signed)
pt concerned about blood pressure being high at night so she split amlodipine to half  2.5 mg and take one at 3:30pm and one at 7:30 pm but her blood pressure is around 190/100 at 10 pm and wanted some suggestion from Amy and as per Amy she can take one Amlodipine at bed time for today  And pt also has appointment tomorrow and will discuss for changing dosage.

## 2015-01-08 NOTE — Telephone Encounter (Signed)
error 

## 2015-01-08 NOTE — Telephone Encounter (Signed)
Spoke to [place] in Endo at this time. No cancellations at this time for Esophageal Manometry.   Returned phone call to patient. Left message for patient to return phone call.

## 2015-01-08 NOTE — Telephone Encounter (Signed)
Pt called she have question(s) about her  Medicine pt call back # is  [phone]

## 2015-01-08 NOTE — Telephone Encounter (Signed)
Patient returned phone call at this time. Information below given. Will call patient back if their is any cancellation prior to patient's appointment.

## 2015-01-08 NOTE — Telephone Encounter (Addendum)
Called pt at listed number with no answer. Called her mobile phone. LMTCB. Please triage what she needs. Thanks! AK

## 2015-01-09 ENCOUNTER — Ambulatory Visit (INDEPENDENT_AMBULATORY_CARE_PROVIDER_SITE_OTHER): Payer: 59 | Admitting: Family Medicine

## 2015-01-09 ENCOUNTER — Other Ambulatory Visit
Admission: RE | Admit: 2015-01-09 | Discharge: 2015-01-09 | Disposition: A | Discharge status: Home / Self Care | Payer: 59 | Source: Ambulatory Visit | Attending: Family Medicine | Admitting: Family Medicine

## 2015-01-09 VITALS — BP 126/78 | HR 80 | Temp 98.1°F | Resp 16 | Ht 65.0 in | Wt 189.0 lb

## 2015-01-09 DIAGNOSIS — I10 Essential (primary) hypertension: Secondary | ICD-10-CM

## 2015-01-09 MED ORDER — AMLODIPINE BESYLATE 10 MG PO TABS: 10.0000 mg | ORAL_TABLET | Freq: Every day | ORAL | Status: DC

## 2015-01-09 NOTE — Progress Notes (Signed)
Subjective:    Patient ID: Angela HamsBettye B Bodine, female    DOB: 03/28/1960, 54 y.o.   MRN: [id number]  HPI: Angela Griffith is a 54 y.o. female presenting on 01/09/2015 for Hypertension   HPI  Pt presents for hypertension follow-up. Taking lisinopril in the AM and amlodipine in the evening. Took lisinopril in the AM. Checking multiple times per day. 125/74. Tends to elevate at night at 5pm up to 170/90. She does not endorse a high salt diet in the evenings. She does report her acid reflux symptoms tend to bother her more at night. No symptoms such as HA or visual changes at the time.  Saw Dr. Wyn Quakerew- no renal artery stenosis. No blockages.  She will have cardiac clearance with Dr. Graciela HusbandsKlein on December 30.   Past Medical History  Diagnosis Date  . GERD (gastroesophageal reflux disease)   . Colon polyps     last colonoscopy in 2015 nml  . Menstrual migraine   . Hypertension   . Hiatal hernia     Current Outpatient Prescriptions on File Prior to Visit  Medication Sig  . amoxicillin (AMOXIL) 875 MG tablet Take 1 tablet (875 mg total) by mouth 2 (two) times daily.  . calcium carbonate (TUMS - DOSED IN MG ELEMENTAL CALCIUM) 500 MG chewable tablet Chew 1 tablet by mouth as needed for indigestion or heartburn.  . dexlansoprazole (DEXILANT) 60 MG capsule Take 1 capsule (60 mg total) by mouth daily.  . fluticasone (FLONASE) 50 MCG/ACT nasal spray Place 2 sprays into both nostrils daily.  Marland Kitchen. lisinopril (PRINIVIL,ZESTRIL) 20 MG tablet Take 1 tablet (20 mg total) by mouth daily.  . ondansetron (ZOFRAN) 4 MG tablet Take 1 tablet (4 mg total) by mouth daily as needed for nausea or vomiting.  . ranitidine (ZANTAC) 150 MG tablet Take 150 mg by mouth 2 (two) times daily.   No current facility-administered medications on file prior to visit.    Review of Systems  Constitutional: Negative for fever and chills.  HENT: Negative.   Respiratory: Negative for shortness of breath.   Cardiovascular:  Negative for chest pain, palpitations and leg swelling.  Gastrointestinal: Negative for abdominal pain and abdominal distention.  Endocrine: Negative for cold intolerance, heat intolerance, polydipsia, polyphagia and polyuria.  Genitourinary: Negative.   Musculoskeletal: Negative.   Neurological: Negative for dizziness, numbness and headaches.  Psychiatric/Behavioral: Negative.    Per HPI unless specifically indicated above     Objective:    BP 126/78 mmHg  Pulse 80  Temp(Src) 98.1 F (36.7 C) (Oral)  Resp 16  Ht 5\' 5"  (1.651 m)  Wt 189 lb (85.73 kg)  BMI 31.45 kg/m2  Wt Readings from Last 3 Encounters:  01/09/15 189 lb (85.73 kg)  12/31/14 189 lb (85.73 kg)  12/29/14 192 lb (87.091 kg)    Physical Exam  Constitutional: She is oriented to person, place, and time. She appears well-developed and well-nourished. No distress.  Neck: Normal range of motion. Neck supple. No thyromegaly present.  Cardiovascular: Normal rate and regular rhythm.  Exam reveals no gallop and no friction rub.   No murmur heard. Pulmonary/Chest: Effort normal and breath sounds normal.  Abdominal: Soft. Bowel sounds are normal. There is no tenderness. There is no rebound.  Musculoskeletal: Normal range of motion. She exhibits no edema or tenderness.  Lymphadenopathy:    She has no cervical adenopathy.  Neurological: She is alert and oriented to person, place, and time.  Skin: Skin is warm and dry. She  is not diaphoretic.   Results for orders placed or performed during the hospital encounter of 12/26/14  Lipase, blood  Result Value Ref Range   Lipase 28 11 - 51 U/L  Comprehensive metabolic panel  Result Value Ref Range   Sodium 139 135 - 145 mmol/L   Potassium 3.9 3.5 - 5.1 mmol/L   Chloride 108 101 - 111 mmol/L   CO2 26 22 - 32 mmol/L   Glucose, Bld 143 (H) 65 - 99 mg/dL   BUN 12 6 - 20 mg/dL   Creatinine, Ser 4.09 0.44 - 1.00 mg/dL   Calcium 9.2 8.9 - 81.1 mg/dL   Total Protein 7.1 6.5 - 8.1  g/dL   Albumin 3.7 3.5 - 5.0 g/dL   AST 14 (L) 15 - 41 U/L   ALT 13 (L) 14 - 54 U/L   Alkaline Phosphatase 57 38 - 126 U/L   Total Bilirubin 0.5 0.3 - 1.2 mg/dL   GFR calc non Af Amer >60 >60 mL/min   GFR calc Af Amer >60 >60 mL/min   Anion gap 5 5 - 15  CBC  Result Value Ref Range   WBC 13.1 (H) 3.6 - 11.0 K/uL   RBC 4.43 3.80 - 5.20 MIL/uL   Hemoglobin 11.7 (L) 12.0 - 16.0 g/dL   HCT 91.4 78.2 - 95.6 %   MCV 81.2 80.0 - 100.0 fL   MCH 26.4 26.0 - 34.0 pg   MCHC 32.5 32.0 - 36.0 g/dL   RDW 21.3 (H) 08.6 - 57.8 %   Platelets 335 150 - 440 K/uL  Urinalysis complete, with microscopic ([facility] only)  Result Value Ref Range   Color, Urine COLORLESS (A) YELLOW   APPearance CLEAR (A) CLEAR   Glucose, UA NEGATIVE NEGATIVE mg/dL   Bilirubin Urine NEGATIVE NEGATIVE   Ketones, ur NEGATIVE NEGATIVE mg/dL   Specific Gravity, Urine 1.003 (L) 1.005 - 1.030   Hgb urine dipstick 1+ (A) NEGATIVE   pH 7.0 5.0 - 8.0   Protein, ur NEGATIVE NEGATIVE mg/dL   Nitrite NEGATIVE NEGATIVE   Leukocytes, UA NEGATIVE NEGATIVE   RBC / HPF 0-5 0 - 5 RBC/hpf   WBC, UA 0-5 0 - 5 WBC/hpf   Bacteria, UA MANY (A) NONE SEEN   Squamous Epithelial / LPF 0-5 (A) NONE SEEN      Assessment & Plan:   Problem List Items Addressed This Visit      Cardiovascular and Mediastinum   Uncontrolled hypertension - Primary    Controlled in the office today. Renal artery stenosis ruled out by vascular. Rule out increased Renin/aldosterone via lab work. Educated patient on how BP fluctuates throughout the day.  A BP of 170/90 is not concerning if it goes back down and she does not have symptoms at the time.   Recommend only checking BP twice daily prior to taking medications. Take lisinopril in the AM and amlodipine in the PM.  Dose of amlodipine increased to cover for BP elevations at night.  Pt encouraged to call if she experiences blood pressure symptoms.       Relevant Medications   amLODipine (NORVASC) 10 MG tablet       Meds ordered this encounter  Medications  . amLODipine (NORVASC) 10 MG tablet    Sig: Take 1 tablet (10 mg total) by mouth daily.    Dispense:  90 tablet    Refill:  3    Order Specific Question:  Supervising Provider    Answer:  Juanetta Gosling  Teresita Madura [[id number]]      Follow up plan: Return in about 4 weeks (around 02/06/2015) for BP check. Marland Kitchen

## 2015-01-09 NOTE — Patient Instructions (Addendum)
Check your blood pressure only twice daily. I think your BP is going up at night either due to acid reflux or maybe stress. As long as you don't have symptoms, assume your BP is okay.  You are on the right medications.  Take lisinopril in the morning. Take amlodipine in the evening. If you feel dizzy or light headed in the evening check your blood pressure.   Please seek immediate medical attention at ER or Urgent Care if you develop: Chest pain, pressure or tightness. Shortness of breath accompanied by nausea or diaphoresis Visual changes Numbness or tingling on one side of the body Facial droop Altered mental status Or any concerning symptoms.

## 2015-01-09 NOTE — Assessment & Plan Note (Signed)
Controlled in the office today. Renal artery stenosis ruled out by vascular. Rule out increased Renin/aldosterone via lab work. Educated patient on how BP fluctuates throughout the day.  A BP of 170/90 is not concerning if it goes back down and she does not have symptoms at the time.   Recommend only checking BP twice daily prior to taking medications. Take lisinopril in the AM and amlodipine in the PM.  Dose of amlodipine increased to cover for BP elevations at night.  Pt encouraged to call if she experiences blood pressure symptoms.

## 2015-01-15 LAB — ALDOSTERONE + RENIN ACTIVITY W/ RATIO
ALDO / PRA RATIO: 3.3 (ref 0.0–30.0)
ALDOSTERONE: 8.5 ng/dL (ref 0.0–30.0)
PRA LC/MS/MS: 2.59 ng/mL/h

## 2015-01-18 ENCOUNTER — Ambulatory Visit (INDEPENDENT_AMBULATORY_CARE_PROVIDER_SITE_OTHER): Payer: 59 | Admitting: Internal Medicine

## 2015-01-18 ENCOUNTER — Encounter: Payer: Self-pay | Admitting: Internal Medicine

## 2015-01-18 VITALS — BP 134/96 | HR 90 | Ht 65.0 in | Wt 187.0 lb

## 2015-01-18 DIAGNOSIS — I10 Essential (primary) hypertension: Secondary | ICD-10-CM | POA: Diagnosis not present

## 2015-01-18 NOTE — Progress Notes (Signed)
ELECTROPHYSIOLOGY CONSULT NOTE  Patient ID: Angela Griffith, MRN: [id number], DOB/AGE: 1960-06-16 54 y.o. Admit date: (Not on file) Date of Consult: 01/18/2015  Primary Physician: Filbert Berthold, NP Primary Cardiologist: new  Chief Complaint: hypertension   HPI Angela Griffith is a 54 y.o. female  With longstanding hypertension which in her mind relates to a recent diagnosis of GERD and HH  BP have been variable but of late she and her PCPs have come upon a fruitful strategy.    She denies sob, doe, cp edema  She does have sleep disordered breathing and some day time somnolence  ER notes multiple visits with various complaints 7 since 10/29/14 Has long standing Hypertension and DM   Past Medical History  Diagnosis Date  . GERD (gastroesophageal reflux disease)   . Colon polyps     last colonoscopy in 2015 nml  . Menstrual migraine   . Hypertension   . Hiatal hernia       Surgical History:  Past Surgical History  Procedure Laterality Date  . Exploratory laparotomy  1993    for endometriosis  . Esophagogastroduodenoscopy (egd) with propofol N/A 11/19/2014    Procedure: ESOPHAGOGASTRODUODENOSCOPY (EGD) WITH PROPOFOL;  Surgeon: [name], MD;  Location: [facility];  Service: Endoscopy;  Laterality: N/A;  . Cesarean section  1985 and 1988  . Abdominal hysterectomy  1996  . Endometrial surgery       Home Meds: Prior to Admission medications   Medication Sig Start Date End Date Taking? Authorizing Provider  amLODipine (NORVASC) 10 MG tablet Take 1 tablet (10 mg total) by mouth daily. 01/09/15   Amy Rusty Aus, NP  amoxicillin (AMOXIL) 875 MG tablet Take 1 tablet (875 mg total) by mouth 2 (two) times daily. 12/28/14   Faythe Ghee, PA-C  calcium carbonate (TUMS - DOSED IN MG ELEMENTAL CALCIUM) 500 MG chewable tablet Chew 1 tablet by mouth as needed for indigestion or heartburn.    Historical Provider, MD  dexlansoprazole (DEXILANT) 60 MG  capsule Take 1 capsule (60 mg total) by mouth daily. 11/16/14   [name], MD  fluticasone (FLONASE) 50 MCG/ACT nasal spray Place 2 sprays into both nostrils daily. 12/28/14   Faythe Ghee, PA-C  lisinopril (PRINIVIL,ZESTRIL) 20 MG tablet Take 1 tablet (20 mg total) by mouth daily. 12/31/14   Amy Lauren Krebs, NP  ondansetron (ZOFRAN) 4 MG tablet Take 1 tablet (4 mg total) by mouth daily as needed for nausea or vomiting. 12/11/14   Amy Rusty Aus, NP  ranitidine (ZANTAC) 150 MG tablet Take 150 mg by mouth 2 (two) times daily.    Historical Provider, MD    Allergies:  Allergies  Allergen Reactions  . Sulfa Antibiotics Hives  . Theophyllines Hives  . Codeine Nausea Only  . Hydrochlorothiazide Hives  . Demerol [Meperidine] Nausea And Vomiting    Social History   Social History  . Marital Status: Single    Spouse Name: N/A  . Number of Children: N/A  . Years of Education: N/A   Occupational History  . Not on file.   Social History Main Topics  . Smoking status: Former Smoker -- 0.25 packs/day for 34 years    Types: Cigarettes    Quit date: 01/19/2014  . Smokeless tobacco: Never Used  . Alcohol Use: No  . Drug Use: No  . Sexual Activity: Not on file   Other Topics Concern  . Not on file   Social History  Narrative     Family History  Problem Relation Age of Onset  . Deep vein thrombosis Father   . Cancer Father     colon  . Alcohol abuse Father   . Hypertension Father   . Anuerysm Maternal Grandmother   . Diabetes Mother   . Stroke Mother   . Hypertension Mother   . Diabetes Maternal Aunt   . Diabetes Maternal Uncle      ROS:  Please see the history of present illness.     All other systems reviewed and negative.    Physical Exam   Blood pressure 134/96, pulse 90, height  (1.651 m), weight 187 lb (84.823 kg), SpO2 99 %. General: Well developed, well nourished female in no acute distress. Head: Normocephalic, atraumatic, sclera non-icteric, no  xanthomas, nares are without discharge. EENT: normal  Lymph Nodes:  none Neck: Negative for carotid bruits. JVD not elevated. Back:without scoliosis kyphosis*  Lungs: Clear bilaterally to auscultation without wheezes, rales, or rhonchi. Breathing is unlabored. Heart: RRR with S1 S2. No   murmur . + S4 No rubs, or gallops appreciated. Abdomen: Soft, non-tender, non-distended with normoactive bowel sounds. No hepatomegaly. No rebound/guarding. No obvious abdominal masses. Msk:  Strength and tone appear normal for age. Extremities: No clubbing or cyanosis. No * edema.  Distal pedal pulses are 2+ and equal bilaterally. Skin: Warm and Dry Neuro: Alert and oriented X 3. CN III-XII intact Grossly normal sensory and motor function . Psych:  Responds to questions appropriately with a normal affect.      Labs: Cardiac Enzymes No results for input(s): CKTOTAL, CKMB, TROPONINI in the last 72 hours. CBC Lab Results  Component Value Date   WBC 13.1* 12/26/2014   HGB 11.7* 12/26/2014   HCT 36.0 12/26/2014   MCV 81.2 12/26/2014   PLT 335 12/26/2014   PROTIME: No results for input(s): LABPROT, INR in the last 72 hours. Chemistry No results for input(s): NA, K, CL, CO2, BUN, CREATININE, CALCIUM, PROT, BILITOT, ALKPHOS, ALT, AST, GLUCOSE in the last 168 hours.  Invalid input(s): LABALBU Lipids Lab Results  Component Value Date   CHOL 215* 09/07/2014   HDL 56 09/07/2014   LDLCALC 148* 09/07/2014   TRIG 55 09/07/2014   BNP No results found for: PROBNP Thyroid Function Tests: No results for input(s): TSH, T4TOTAL, T3FREE, THYROIDAB in the last 72 hours.  Invalid input(s): FREET3 Miscellaneous No results found for: DDIMER  Radiology/Studies:  Ct Abdomen Pelvis W Contrast  12/27/2014  CLINICAL DATA:  Acute onset of epigastric pressure. High blood pressure. Initial encounter. EXAM: CT ABDOMEN AND PELVIS WITH CONTRAST TECHNIQUE: Multidetector CT imaging of the abdomen and pelvis was  performed using the standard protocol following bolus administration of intravenous contrast. CONTRAST:  OMNIPAQUE IOHEXOL 300 MG/ML  SOLN COMPARISON:  Right upper quadrant ultrasound performed 10/24/2014 FINDINGS: The visualized lung bases are clear.  No hiatal hernia is seen. The liver and spleen are unremarkable in appearance. The gallbladder is within normal limits. The pancreas and adrenal glands are unremarkable. The kidneys are unremarkable in appearance. There is no evidence of hydronephrosis. No renal or ureteral stones are seen. No perinephric stranding is appreciated. No free fluid is identified. The small bowel is unremarkable in appearance. The stomach is within normal limits. No acute vascular abnormalities are seen. Minimal calcification is noted along the distal abdominal aorta and its branches. The appendix is normal in caliber and contains air, without evidence of appendicitis. The colon is unremarkable in  appearance. The bladder is mildly distended and grossly unremarkable. The patient is status post hysterectomy. No suspicious adnexal masses are seen. No inguinal lymphadenopathy is seen. No acute osseous abnormalities are identified. Vacuum phenomenon is noted at L5-S1. IMPRESSION: No acute abnormality seen within the abdomen or pelvis. No hiatal hernia seen. Electronically Signed   By: Roanna RaiderJeffery  Chang M.D.   On: 12/27/2014 01:29    EKG: SR @  69  16/07/37 PRWP NSST  Assessment and Plan:  Hypertension  Sleep disordered breathing    BP is better controlled.   She has sleep disordered breathing and would recommend sleep study  Will see prn        Sherryl MangesSteven Klein

## 2015-01-18 NOTE — Patient Instructions (Addendum)
Medication Instructions:  Your physician recommends that you continue on your current medications as directed. Please refer to the Current Medication list given to you today.  Labwork: None ordered  Testing/Procedures: Your physician has requested that you have an echocardiogram in [place]. Echocardiography is a painless test that uses sound waves to create images of your heart. It provides your doctor with information about the size and shape of your heart and how well your heart's chambers and valves are working. This procedure takes approximately one hour. There are no restrictions for this procedure.  Follow-Up: No follow up is needed at this time with Dr. Graciela HusbandsKlein.  He will see you on an as needed basis.  If you need a refill on your cardiac medications before your next appointment, please call your pharmacy.  Thank you for choosing [facility]!!   S

## 2015-01-23 ENCOUNTER — Telehealth: Payer: Self-pay | Admitting: General Surgery

## 2015-01-23 NOTE — Telephone Encounter (Signed)
Called patient back and had to leave a message with her husband to call me back.

## 2015-01-23 NOTE — Telephone Encounter (Signed)
Patient left a voice message and said she is having a surgery and wanted to know how many days should she ask off from work?

## 2015-01-24 ENCOUNTER — Other Ambulatory Visit: Payer: Self-pay

## 2015-01-24 ENCOUNTER — Other Ambulatory Visit: Payer: Self-pay | Admitting: Internal Medicine

## 2015-01-24 ENCOUNTER — Ambulatory Visit (INDEPENDENT_AMBULATORY_CARE_PROVIDER_SITE_OTHER): Payer: 59

## 2015-01-24 DIAGNOSIS — I10 Essential (primary) hypertension: Secondary | ICD-10-CM | POA: Diagnosis not present

## 2015-01-24 DIAGNOSIS — I119 Hypertensive heart disease without heart failure: Secondary | ICD-10-CM

## 2015-01-29 ENCOUNTER — Telehealth: Payer: Self-pay | Admitting: Physician Assistant

## 2015-01-29 NOTE — Telephone Encounter (Signed)
Let patient know we can either switch the antibiotic or she can cut the pills in 1/2 and spread out the dosing throughout the day

## 2015-01-30 ENCOUNTER — Ambulatory Visit
Admission: RE | Admit: 2015-01-30 | Discharge: 2015-01-30 | Disposition: A | Discharge status: Home / Self Care | Payer: 59 | Source: Ambulatory Visit | Attending: Gastroenterology | Admitting: Gastroenterology

## 2015-01-30 ENCOUNTER — Encounter
Admission: RE | Disposition: A | Discharge status: Home / Self Care | Payer: Self-pay | Source: Ambulatory Visit | Attending: Gastroenterology

## 2015-01-30 DIAGNOSIS — Z888 Allergy status to other drugs, medicaments and biological substances status: Secondary | ICD-10-CM | POA: Diagnosis not present

## 2015-01-30 DIAGNOSIS — I10 Essential (primary) hypertension: Secondary | ICD-10-CM | POA: Insufficient documentation

## 2015-01-30 DIAGNOSIS — Z811 Family history of alcohol abuse and dependence: Secondary | ICD-10-CM | POA: Insufficient documentation

## 2015-01-30 DIAGNOSIS — Z90710 Acquired absence of both cervix and uterus: Secondary | ICD-10-CM | POA: Insufficient documentation

## 2015-01-30 DIAGNOSIS — G479 Sleep disorder, unspecified: Secondary | ICD-10-CM | POA: Diagnosis not present

## 2015-01-30 DIAGNOSIS — Z833 Family history of diabetes mellitus: Secondary | ICD-10-CM | POA: Insufficient documentation

## 2015-01-30 DIAGNOSIS — K449 Diaphragmatic hernia without obstruction or gangrene: Secondary | ICD-10-CM | POA: Insufficient documentation

## 2015-01-30 DIAGNOSIS — Z823 Family history of stroke: Secondary | ICD-10-CM | POA: Diagnosis not present

## 2015-01-30 DIAGNOSIS — Z885 Allergy status to narcotic agent status: Secondary | ICD-10-CM | POA: Insufficient documentation

## 2015-01-30 DIAGNOSIS — E119 Type 2 diabetes mellitus without complications: Secondary | ICD-10-CM | POA: Insufficient documentation

## 2015-01-30 DIAGNOSIS — Z79899 Other long term (current) drug therapy: Secondary | ICD-10-CM | POA: Diagnosis not present

## 2015-01-30 DIAGNOSIS — Z8249 Family history of ischemic heart disease and other diseases of the circulatory system: Secondary | ICD-10-CM | POA: Insufficient documentation

## 2015-01-30 DIAGNOSIS — Z882 Allergy status to sulfonamides status: Secondary | ICD-10-CM | POA: Insufficient documentation

## 2015-01-30 DIAGNOSIS — K219 Gastro-esophageal reflux disease without esophagitis: Secondary | ICD-10-CM | POA: Diagnosis not present

## 2015-01-30 DIAGNOSIS — Z800 Family history of malignant neoplasm of digestive organs: Secondary | ICD-10-CM | POA: Insufficient documentation

## 2015-01-30 DIAGNOSIS — Z87891 Personal history of nicotine dependence: Secondary | ICD-10-CM | POA: Diagnosis not present

## 2015-01-30 HISTORY — PX: ESOPHAGEAL MANOMETRY: SHX5429

## 2015-01-30 SURGERY — MANOMETRY, ESOPHAGUS

## 2015-01-30 MED ORDER — BUTAMBEN-TETRACAINE-BENZOCAINE 2-2-14 % EX AERO
INHALATION_SPRAY | CUTANEOUS | Status: AC
  Administered 2015-01-30: 1 via TOPICAL
  Filled 2015-01-30: qty 20

## 2015-01-30 MED ORDER — LIDOCAINE HCL 2 % EX GEL
1.0000 "application " | Freq: Once | CUTANEOUS | Status: AC
  Administered 2015-01-30: 2

## 2015-01-30 MED ORDER — LIDOCAINE HCL 2 % EX GEL
CUTANEOUS | Status: AC
Start: 2015-01-30 — End: 2015-01-30
  Administered 2015-01-30: 2
  Filled 2015-01-30: qty 5

## 2015-01-30 MED ORDER — BUTAMBEN-TETRACAINE-BENZOCAINE 2-2-14 % EX AERO
1.0000 | INHALATION_SPRAY | Freq: Once | CUTANEOUS | Status: AC
  Administered 2015-01-30: 1 via TOPICAL

## 2015-01-30 SURGICAL SUPPLY — 2 items
FACESHIELD LNG OPTICON STERILE (SAFETY) IMPLANT
GLOVE BIO SURGEON STRL SZ8 (GLOVE) ×6 IMPLANT

## 2015-01-31 ENCOUNTER — Encounter: Payer: Self-pay | Admitting: Gastroenterology

## 2015-01-31 ENCOUNTER — Ambulatory Visit: Payer: 59 | Admitting: Family Medicine

## 2015-02-14 ENCOUNTER — Ambulatory Visit (INDEPENDENT_AMBULATORY_CARE_PROVIDER_SITE_OTHER): Payer: 59 | Admitting: General Surgery

## 2015-02-14 ENCOUNTER — Encounter: Payer: Self-pay | Admitting: General Surgery

## 2015-02-14 ENCOUNTER — Telehealth: Payer: Self-pay

## 2015-02-14 VITALS — BP 136/78 | HR 64 | Temp 98.3°F | Ht 65.0 in | Wt 185.0 lb

## 2015-02-14 DIAGNOSIS — K449 Diaphragmatic hernia without obstruction or gangrene: Secondary | ICD-10-CM

## 2015-02-14 NOTE — Patient Instructions (Signed)
We will be sending you to [facility]. They will be calling you with your appointment.

## 2015-02-14 NOTE — Telephone Encounter (Signed)
Referral to [facility] was faxed. They will contact patient with location, date and time.

## 2015-02-14 NOTE — Progress Notes (Signed)
Outpatient Surgical Follow Up  02/14/2015  Angela Griffith is an 55 y.o. female.   Chief Complaint  Patient presents with  . Follow-up    Go over Manometry results    HPI: 55 year old female returns to clinic for continued evaluation of her hiatal hernia and esophageal reflux. She has completed esophageal manometry since her last visit. She's also had numerous other medical appointments with other providers secondary to her uncontrolled hypertension and other medical problems. She states that over the last 2 months she has gradually stopped taking the dexalant on a daily basis, she now only takes Zantac as needed per her diet. She does continue to have some complaints of dysphagia, with the sensation of food getting stuck in her esophagus. She states this is also less frequent than before her prior workup. She denies any fevers, chills, nausea, vomiting, chest pain, shortness breath, diarrhea, constipation.  Past Medical History  Diagnosis Date  . GERD (gastroesophageal reflux disease)   . Colon polyps     last colonoscopy in 2015 nml  . Menstrual migraine   . Hypertension   . Hiatal hernia     Past Surgical History  Procedure Laterality Date  . Exploratory laparotomy  1993    for endometriosis  . Esophagogastroduodenoscopy (egd) with propofol N/A 11/19/2014    Procedure: ESOPHAGOGASTRODUODENOSCOPY (EGD) WITH PROPOFOL;  Surgeon: [name], MD;  Location: [facility];  Service: Endoscopy;  Laterality: N/A;  . Cesarean section  1985 and 1988  . Abdominal hysterectomy  1996  . Endometrial surgery    . Esophageal manometry N/A 01/30/2015    Procedure: ESOPHAGEAL MANOMETRY (EM);  Surgeon: Elnita Maxwell, MD;  Location: [facility];  Service: Endoscopy;  Laterality: N/A;    Family History  Problem Relation Age of Onset  . Deep vein thrombosis Father   . Cancer Father     colon  . Alcohol abuse Father   . Hypertension Father   . Anuerysm Maternal Grandmother    . Diabetes Mother   . Stroke Mother   . Hypertension Mother   . Diabetes Maternal Aunt   . Diabetes Maternal Uncle     Social History:  reports that she quit smoking about 12 months ago. Her smoking use included Cigarettes. She has a 8.5 pack-year smoking history. She has never used smokeless tobacco. She reports that she does not drink alcohol or use illicit drugs.  Allergies:  Allergies  Allergen Reactions  . Sulfa Antibiotics Hives  . Theophyllines Hives  . Codeine Nausea Only  . Hydrochlorothiazide Hives  . Demerol [Meperidine] Nausea And Vomiting    Medications reviewed.    ROS A multipoint review of systems was completed. All pertinent positives and negatives were documented within the history of present illness and the remainder negative.   BP 136/78 mmHg  Pulse 64  Temp(Src) 98.3 F (36.8 C) (Oral)  Ht  (1.651 m)  Wt 83.915 kg (185 lb)  BMI 30.79 kg/m2  Physical Exam Gen.: No acute distress Neck: Supple and nontender Lymph nodes: No evidence of cervical, regular, axillary lymphadenopathy Chest: Clear to auscultation Heart regular rate and rhythm Abdomen: Soft, nontender, nondistended    No results found for this or any previous visit (from the past 48 hour(s)). No results found. Esophageal manometry results reviewed with the patient. They do show an elevated resting lower esophageal sphincter as well as an elevated resting upper esophageal sphincter tone. Although it shows normal transit of food boluses it shows that only  67% of the boluses complete the transit. Assessment/Plan:  1. Hiatal hernia 55 year old female with asymptomatic hiatal hernia. Discussed her results with her in detail. Given the finding of the decreased percentage of complete transits discussed the importance of her to get another surgical opinion from a surgical specialist at a university. We will plan to send her to [facility] for an additional opinion as to whether not any other  esophageal workup is required or if a different surgical approaches indicated. She will follow up with [place] on an as-needed basis.     Ricarda Frame, MD FACS General Surgeon  02/14/2015,10:50 AM

## 2015-02-21 ENCOUNTER — Telehealth: Payer: Self-pay

## 2015-02-21 DIAGNOSIS — E119 Type 2 diabetes mellitus without complications: Secondary | ICD-10-CM | POA: Diagnosis not present

## 2015-02-21 LAB — HM DIABETES EYE EXAM

## 2015-02-21 NOTE — Telephone Encounter (Signed)
Patient's referral to [facility] was faxed today again.   Faxed to [phone].

## 2015-02-25 ENCOUNTER — Telehealth: Payer: Self-pay | Admitting: Family Medicine

## 2015-02-25 NOTE — Telephone Encounter (Signed)
Spoke with patient- Suggested pt take lisinopril now and get back on normal schedule.

## 2015-02-25 NOTE — Telephone Encounter (Signed)
Please suggest? 

## 2015-02-25 NOTE — Telephone Encounter (Signed)
Pt usually takes her amlodipine in the pm but messed up and took it this morning.  She asked if she should do lisniporil in the pm because of that.  Her call back number is [phone]

## 2015-03-08 ENCOUNTER — Encounter: Payer: Self-pay | Admitting: Family Medicine

## 2015-03-08 ENCOUNTER — Ambulatory Visit (INDEPENDENT_AMBULATORY_CARE_PROVIDER_SITE_OTHER): Payer: 59 | Admitting: Family Medicine

## 2015-03-08 VITALS — BP 131/78 | HR 65 | Temp 98.2°F | Resp 16 | Ht 65.0 in | Wt 184.6 lb

## 2015-03-08 DIAGNOSIS — E785 Hyperlipidemia, unspecified: Secondary | ICD-10-CM

## 2015-03-08 DIAGNOSIS — I10 Essential (primary) hypertension: Secondary | ICD-10-CM

## 2015-03-08 DIAGNOSIS — K449 Diaphragmatic hernia without obstruction or gangrene: Secondary | ICD-10-CM

## 2015-03-08 DIAGNOSIS — E119 Type 2 diabetes mellitus without complications: Secondary | ICD-10-CM | POA: Diagnosis not present

## 2015-03-08 DIAGNOSIS — E1169 Type 2 diabetes mellitus with other specified complication: Secondary | ICD-10-CM | POA: Diagnosis not present

## 2015-03-08 NOTE — Assessment & Plan Note (Signed)
Check lipid panel and discuss statin therapy for diabetes with patient.

## 2015-03-08 NOTE — Assessment & Plan Note (Signed)
Recheck A1c on 2/23. Pt wants to continue only lifestyle changes pending A1c. Have discussed either retrying low dose metformin now that GI symptoms are better or trying sulfonyurea or SGLT-2 as monotherapy. Foot exam UTD. Eye exam UTD. Ace for renal protection.  Recheck 3 mos.

## 2015-03-08 NOTE — Progress Notes (Signed)
Subjective:    Patient ID: Angela Griffith, female    DOB: 02/03/1960, 55 y.o.   MRN: [id number]  HPI: Angela Griffith is a 55 y.o. female presenting on 03/08/2015 for Hypertension and Diabetes   HPI  Pt presents for diabetes and HTN follow-up.  She is also going to [facility] on Feb 28 for a surgical consult for hiatal hernia.  BP doing well with amlodipine in the evening and lisinopril AM. Controlling BP well. No HA or visual changes. No Shorntes of breath. Has made diet changes. Exercising daily.  Diabetes: Got eye exam. Doing well. Pt was unable to tolerate metformin and januvia. Doing only diet changes and exercise. No numbness or tingling in her feet. No checking sugars regularly. Avoids sweets. Eating mostly chicken and vegetables. Drinks water. A1c is due on 2/23.     Past Medical History  Diagnosis Date  . GERD (gastroesophageal reflux disease)   . Colon polyps     last colonoscopy in 2015 nml  . Menstrual migraine   . Hypertension   . Hiatal hernia     Current Outpatient Prescriptions on File Prior to Visit  Medication Sig  . amLODipine (NORVASC) 10 MG tablet Take 1 tablet (10 mg total) by mouth daily.  . calcium carbonate (TUMS - DOSED IN MG ELEMENTAL CALCIUM) 500 MG chewable tablet Chew 1 tablet by mouth as needed for indigestion or heartburn.  . fluticasone (FLONASE) 50 MCG/ACT nasal spray Place 2 sprays into both nostrils daily.  Marland Kitchen lisinopril (PRINIVIL,ZESTRIL) 20 MG tablet Take 1 tablet (20 mg total) by mouth daily.  . ondansetron (ZOFRAN) 4 MG tablet Take 1 tablet (4 mg total) by mouth daily as needed for nausea or vomiting.  . ranitidine (ZANTAC) 150 MG tablet Take 150 mg by mouth 2 (two) times daily.   No current facility-administered medications on file prior to visit.    Review of Systems  Constitutional: Negative for fever and chills.  HENT: Negative.   Eyes: Negative for visual disturbance.  Respiratory: Negative for shortness of breath.     Cardiovascular: Negative for chest pain, palpitations and leg swelling.  Gastrointestinal: Negative for abdominal pain and abdominal distention.  Endocrine: Negative for cold intolerance, heat intolerance, polydipsia, polyphagia and polyuria.  Genitourinary: Negative.   Musculoskeletal: Negative.   Neurological: Negative for dizziness, numbness and headaches.  Psychiatric/Behavioral: Negative.    Per HPI unless specifically indicated above     Objective:    BP 131/78 mmHg  Pulse 65  Temp(Src) 98.2 F (36.8 C) (Oral)  Resp 16  Ht  (1.651 m)  Wt 184 lb 9.6 oz (83.734 kg)  BMI 30.72 kg/m2  Wt Readings from Last 3 Encounters:  03/08/15 184 lb 9.6 oz (83.734 kg)  02/14/15 185 lb (83.915 kg)  01/18/15 187 lb (84.823 kg)    Physical Exam  Constitutional: She is oriented to person, place, and time. She appears well-developed and well-nourished.  HENT:  Head: Normocephalic and atraumatic.  Neck: Neck supple.  Cardiovascular: Normal rate, regular rhythm and normal heart sounds.  Exam reveals no gallop and no friction rub.   No murmur heard. Pulmonary/Chest: Effort normal and breath sounds normal. She has no wheezes. She exhibits no tenderness.  Abdominal: Soft. Normal appearance and bowel sounds are normal. She exhibits no distension and no mass. There is no tenderness. There is no rebound and no guarding.  Musculoskeletal: Normal range of motion. She exhibits no edema or tenderness.  Lymphadenopathy:    She  has no cervical adenopathy.  Neurological: She is alert and oriented to person, place, and time.  Skin: Skin is warm and dry.   Results for orders placed or performed in visit on 03/01/15  HM DIABETES EYE EXAM  Result Value Ref Range   HM Diabetic Eye Exam No Retinopathy No Retinopathy      Assessment & Plan:   Problem List Items Addressed This Visit      Cardiovascular and Mediastinum   Hypertension    Controlled today. Continue current dosing regimen. BMP WNL.  Encouraged continued diet and lifestyle changes.  Recheck 3 mos.         Respiratory   Hiatal hernia    Symptoms are doing better. Pt to [facility] for evaluation and management.         Endocrine   Diabetes mellitus without complication (HCC) - Primary    Recheck A1c on 2/23. Pt wants to continue only lifestyle changes pending A1c. Have discussed either retrying low dose metformin now that GI symptoms are better or trying sulfonyurea or SGLT-2 as monotherapy. Foot exam UTD. Eye exam UTD. Ace for renal protection.  Recheck 3 mos.       Relevant Orders   Hemoglobin A1c     Other   Hyperlipidemia associated with type 2 diabetes mellitus (HCC)    Check lipid panel and discuss statin therapy for diabetes with patient.       Relevant Orders   Lipid panel      No orders of the defined types were placed in this encounter.      Follow up plan: Return in about 3 months (around 06/05/2015) for diabetes. Marland Kitchen

## 2015-03-08 NOTE — Assessment & Plan Note (Signed)
Controlled today. Continue current dosing regimen. BMP WNL. Encouraged continued diet and lifestyle changes.  Recheck 3 mos.

## 2015-03-08 NOTE — Assessment & Plan Note (Signed)
Symptoms are doing better. Pt to [facility] for evaluation and management.

## 2015-03-08 NOTE — Patient Instructions (Signed)
Please get your A1c done after or on Mar 14, 2015.  Continue your diet and lifestyle changes.   Your goal blood pressure is 140/90. Work on low salt/sodium diet - goal <2.5gm (2,500mg ) per day. Eat a diet high in fruits/vegetables and whole grains.  Look into mediterranean and DASH diet. Goal activity is 189min/wk of moderate intensity exercise.  This can be split into 30 minute chunks.  If you are not at this level, you can start with smaller 10-15 min increments and slowly build up activity. Look at www.heart.org for more resources  Please seek immediate medical attention at ER or Urgent Care if you develop: Chest pain, pressure or tightness. Shortness of breath accompanied by nausea or diaphoresis Visual changes Numbness or tingling on one side of the body Facial droop Altered mental status Or any concerning symptoms.

## 2015-03-14 ENCOUNTER — Other Ambulatory Visit
Admission: RE | Admit: 2015-03-14 | Discharge: 2015-03-14 | Disposition: A | Discharge status: Home / Self Care | Payer: 59 | Source: Ambulatory Visit | Attending: Family Medicine | Admitting: Family Medicine

## 2015-03-14 DIAGNOSIS — E785 Hyperlipidemia, unspecified: Secondary | ICD-10-CM | POA: Diagnosis not present

## 2015-03-14 DIAGNOSIS — E119 Type 2 diabetes mellitus without complications: Secondary | ICD-10-CM | POA: Insufficient documentation

## 2015-03-14 DIAGNOSIS — E1169 Type 2 diabetes mellitus with other specified complication: Secondary | ICD-10-CM | POA: Insufficient documentation

## 2015-03-14 LAB — LIPID PANEL
CHOL/HDL RATIO: 3.9 ratio
CHOLESTEROL: 204 mg/dL — AB (ref 0–200)
HDL: 52 mg/dL (ref >40)
LDL Cholesterol: 138 mg/dL — ABNORMAL HIGH (ref 0–99)
Triglycerides: 70 mg/dL (ref <150)
VLDL: 14 mg/dL (ref 0–40)

## 2015-03-14 LAB — HEMOGLOBIN A1C: HEMOGLOBIN A1C: 6.3 % — AB (ref 4.0–6.0)

## 2015-03-19 DIAGNOSIS — K449 Diaphragmatic hernia without obstruction or gangrene: Secondary | ICD-10-CM | POA: Diagnosis not present

## 2015-03-19 DIAGNOSIS — E118 Type 2 diabetes mellitus with unspecified complications: Secondary | ICD-10-CM | POA: Diagnosis not present

## 2015-04-04 ENCOUNTER — Ambulatory Visit: Payer: 59 | Admitting: Internal Medicine

## 2015-08-05 ENCOUNTER — Other Ambulatory Visit: Payer: Self-pay | Admitting: Family Medicine

## 2015-08-06 ENCOUNTER — Other Ambulatory Visit: Payer: Self-pay | Admitting: Family Medicine

## 2015-08-06 ENCOUNTER — Encounter: Payer: Self-pay | Admitting: Family Medicine

## 2015-08-06 DIAGNOSIS — I10 Essential (primary) hypertension: Secondary | ICD-10-CM

## 2015-08-06 MED ORDER — AMLODIPINE BESYLATE 10 MG PO TABS: 10.0000 mg | ORAL_TABLET | Freq: Every day | ORAL | Status: DC

## 2015-08-06 NOTE — Telephone Encounter (Signed)
Pt called requesting a refill on  Amlodipine  [facility]  [place]  Pt. Call back # is  [phone]

## 2015-08-27 ENCOUNTER — Encounter: Payer: 59 | Admitting: Family Medicine

## 2015-09-09 NOTE — Telephone Encounter (Signed)
Duplicate.[place]

## 2015-09-13 ENCOUNTER — Encounter: Payer: Self-pay | Admitting: Family Medicine

## 2015-09-13 ENCOUNTER — Ambulatory Visit (INDEPENDENT_AMBULATORY_CARE_PROVIDER_SITE_OTHER): Payer: 59 | Admitting: Family Medicine

## 2015-09-13 VITALS — BP 138/76 | HR 84 | Temp 98.6°F | Resp 16 | Ht 65.0 in | Wt 197.0 lb

## 2015-09-13 DIAGNOSIS — E1169 Type 2 diabetes mellitus with other specified complication: Secondary | ICD-10-CM | POA: Diagnosis not present

## 2015-09-13 DIAGNOSIS — Z0000 Encounter for general adult medical examination without abnormal findings: Secondary | ICD-10-CM

## 2015-09-13 DIAGNOSIS — I10 Essential (primary) hypertension: Secondary | ICD-10-CM

## 2015-09-13 DIAGNOSIS — E119 Type 2 diabetes mellitus without complications: Secondary | ICD-10-CM | POA: Diagnosis not present

## 2015-09-13 DIAGNOSIS — Z1239 Encounter for other screening for malignant neoplasm of breast: Secondary | ICD-10-CM

## 2015-09-13 DIAGNOSIS — E785 Hyperlipidemia, unspecified: Secondary | ICD-10-CM

## 2015-09-13 NOTE — Assessment & Plan Note (Signed)
Controlled. Continue current regimen. Check CMP today.

## 2015-09-13 NOTE — Patient Instructions (Addendum)

## 2015-09-13 NOTE — Progress Notes (Signed)
Subjective:    Patient ID: Angela Griffith Speedy, female    DOB: January 17, 1961, 55 y.o.   MRN: [id number]  HPI: Angela Griffith Ambrosio is a 55 y.o. female presenting on 09/13/2015 for Annual Exam (last pap 2000 pt had total hysterectomy.)   HPI  Pt presents for Well Woman exam today. Had Total hysterectomy and oophorectomy for endometriosis in 2000.  Does not need pap today. Last mammogram 2 years ago. Would like to schedule this year.  Trying to exercise and eat well. No further issues with acid reflux. Just taking Zantac as needed.  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    Past Medical History:  Diagnosis Date  . Colon polyps    last colonoscopy in 2015 nml  . GERD (gastroesophageal reflux disease)   . Hiatal hernia   . Hypertension   . Menstrual migraine    Social History   Social History  . Marital status: Single    Spouse name: N/A  . Number of children: N/A  . Years of education: N/A   Occupational History  . Not on file.   Social History Main Topics  . Smoking status: Former Smoker    Packs/day: 0.25    Years: 34.00    Types: Cigarettes    Quit date: 01/19/2014  . Smokeless tobacco: Never Used  . Alcohol use No  . Drug use: No  . Sexual activity: Not on file   Other Topics Concern  . Not on file   Social History Narrative  . No narrative on file   Family History  Problem Relation Age of Onset  . Deep vein thrombosis Father   . Cancer Father     colon  . Alcohol abuse Father   . Hypertension Father   . Anuerysm Maternal Grandmother   . Diabetes Mother   . Stroke Mother   . Hypertension Mother   . Diabetes Maternal Aunt   . Diabetes Maternal Uncle    Current Outpatient Prescriptions on File Prior to Visit  Medication Sig  . amLODipine (NORVASC) 10 MG tablet Take 1 tablet (10 mg total) by mouth daily.  . calcium carbonate (TUMS - DOSED IN MG ELEMENTAL CALCIUM) 500 MG chewable tablet Chew 1 tablet by mouth as needed for indigestion or heartburn.   . fluticasone (FLONASE) 50 MCG/ACT nasal spray Place 2 sprays into both nostrils daily.  Marland Kitchen. lisinopril (PRINIVIL,ZESTRIL) 20 MG tablet Take 1 tablet (20 mg total) by mouth daily.  . ranitidine (ZANTAC) 150 MG tablet Take 150 mg by mouth 2 (two) times daily.   No current facility-administered medications on file prior to visit.     Review of Systems  Constitutional: Negative for chills and fever.  HENT: Negative.   Respiratory: Negative for cough, chest tightness and wheezing.   Cardiovascular: Negative for chest pain and leg swelling.  Gastrointestinal: Negative for abdominal pain, constipation, diarrhea, nausea and vomiting.  Endocrine: Negative.  Negative for cold intolerance, heat intolerance, polydipsia, polyphagia and polyuria.  Genitourinary: Negative for difficulty urinating and dysuria.  Musculoskeletal: Negative.   Neurological: Negative for dizziness, light-headedness and numbness.  Psychiatric/Behavioral: Negative.    Per HPI unless specifically indicated above     Objective:    BP 138/76 (BP Location: Left Arm)   Pulse 84   Temp 98.6 F (37 C) (Oral)   Resp 16   Ht 5\' 5"  (1.651 m)   Wt 197 lb (89.4 kg)   BMI 32.78 kg/m   Wt Readings from  Last 3 Encounters:  09/13/15 197 lb (89.4 kg)  03/08/15 184 lb 9.6 oz (83.7 kg)  02/14/15 185 lb (83.9 kg)    Physical Exam  Constitutional: She is oriented to person, place, and time. She appears well-developed and well-nourished.  HENT:  Head: Normocephalic and atraumatic.  Neck: Neck supple.  Cardiovascular: Normal rate, regular rhythm and normal heart sounds.  Exam reveals no gallop and no friction rub.   No murmur heard. Pulmonary/Chest: Effort normal and breath sounds normal. She has no wheezes. She exhibits no tenderness. Right breast exhibits no inverted nipple, no mass, no nipple discharge, no skin change and no tenderness. Left breast exhibits no inverted nipple, no mass, no nipple discharge, no skin change and no  tenderness. Breasts are symmetrical.  Abdominal: Soft. Normal appearance and bowel sounds are normal. She exhibits no distension and no mass. There is no tenderness. There is no rebound and no guarding.  Musculoskeletal: Normal range of motion. She exhibits no edema or tenderness.  Lymphadenopathy:    She has no cervical adenopathy.  Neurological: She is alert and oriented to person, place, and time.  Skin: Skin is warm and dry.   Results for orders placed or performed during the hospital encounter of 03/14/15  Hemoglobin A1c  Result Value Ref Range   Hgb A1c MFr Bld 6.3 (H) 4.0 - 6.0 %  Lipid panel  Result Value Ref Range   Cholesterol 204 (H) 0 - 200 mg/dL   Triglycerides 70 <960 mg/dL   HDL 52 >45 mg/dL   Total CHOL/HDL Ratio 3.9 RATIO   VLDL 14 0 - 40 mg/dL   LDL Cholesterol 409 (H) 0 - 99 mg/dL      Assessment & Plan:   Problem List Items Addressed This Visit      Cardiovascular and Mediastinum   Hypertension    Controlled. Continue current regimen. Check CMP today.       Relevant Orders   COMPLETE METABOLIC PANEL WITH GFR     Endocrine   Diabetes mellitus without complication (HCC)    Will check A1c today. Currently diet controlled. Encouraged diet and lifestyle changes.       Relevant Orders   HgB A1c     Other   Hyperlipidemia associated with type 2 diabetes mellitus (HCC)    Pt refused statin therapy previously and wanted to try lifestyle changes. Will recheck lipids today. Consider statin therapy with LDL >70.      Relevant Orders   Lipid Profile    Other Visit Diagnoses    Well woman exam (no gynecological exam)    -  Primary   Health maintenance reviewed. Encouraged mammogram.    Relevant Orders   MM Digital Diagnostic Bilat   Vitamin D (25 hydroxy)   Screening for breast cancer       Relevant Orders   MM Digital Diagnostic Bilat      No orders of the defined types were placed in this encounter.     Follow up plan: Return in about 3  months (around 12/14/2015), or if symptoms worsen or fail to improve, for diabetes. Marland Kitchen

## 2015-09-13 NOTE — Assessment & Plan Note (Signed)
Will check A1c today. Currently diet controlled. Encouraged diet and lifestyle changes.

## 2015-09-13 NOTE — Assessment & Plan Note (Signed)
Pt refused statin therapy previously and wanted to try lifestyle changes. Will recheck lipids today. Consider statin therapy with LDL >70.

## 2015-09-14 LAB — LIPID PANEL
CHOL/HDL RATIO: 3.1 ratio (ref <=5.0)
CHOLESTEROL: 196 mg/dL (ref 125–200)
HDL: 64 mg/dL (ref >=46)
LDL Cholesterol: 119 mg/dL (ref <130)
TRIGLYCERIDES: 67 mg/dL (ref <150)
VLDL: 13 mg/dL (ref <30)

## 2015-09-14 LAB — COMPLETE METABOLIC PANEL WITH GFR
ALT: 15 U/L (ref 6–29)
AST: 11 U/L (ref 10–35)
Albumin: 3.8 g/dL (ref 3.6–5.1)
Alkaline Phosphatase: 70 U/L (ref 33–130)
BUN: 13 mg/dL (ref 7–25)
CALCIUM: 9.2 mg/dL (ref 8.6–10.4)
CO2: 24 mmol/L (ref 20–31)
Chloride: 107 mmol/L (ref 98–110)
Creat: 0.76 mg/dL (ref 0.50–1.05)
GFR, EST NON AFRICAN AMERICAN: 89 mL/min (ref >=60)
Glucose, Bld: 122 mg/dL — ABNORMAL HIGH (ref 65–99)
POTASSIUM: 4.1 mmol/L (ref 3.5–5.3)
Sodium: 141 mmol/L (ref 135–146)
Total Bilirubin: 0.4 mg/dL (ref 0.2–1.2)
Total Protein: 6.5 g/dL (ref 6.1–8.1)

## 2015-09-14 LAB — HEMOGLOBIN A1C
Hgb A1c MFr Bld: 6.9 % — ABNORMAL HIGH (ref <5.7)
Mean Plasma Glucose: 151 mg/dL

## 2015-09-14 LAB — VITAMIN D 25 HYDROXY (VIT D DEFICIENCY, FRACTURES): Vit D, 25-Hydroxy: 59 ng/mL (ref 30–100)

## 2015-09-16 ENCOUNTER — Other Ambulatory Visit: Payer: Self-pay | Admitting: Family Medicine

## 2015-09-16 DIAGNOSIS — E785 Hyperlipidemia, unspecified: Principal | ICD-10-CM

## 2015-09-16 DIAGNOSIS — E1169 Type 2 diabetes mellitus with other specified complication: Secondary | ICD-10-CM

## 2015-09-16 DIAGNOSIS — E119 Type 2 diabetes mellitus without complications: Secondary | ICD-10-CM

## 2015-09-16 MED ORDER — CANAGLIFLOZIN 100 MG PO TABS: 100.0000 mg | ORAL_TABLET | Freq: Every day | ORAL | 11 refills | Status: DC

## 2015-09-16 MED ORDER — ATORVASTATIN CALCIUM 10 MG PO TABS: 10.0000 mg | ORAL_TABLET | Freq: Every day | ORAL | 11 refills | Status: DC

## 2015-11-13 ENCOUNTER — Telehealth: Payer: Self-pay | Admitting: Family Medicine

## 2015-11-13 NOTE — Telephone Encounter (Signed)
Pt said she did not pick up invokana and lipitor from pharmacy they last time they were called in.  Please res[facility] prescriptions to [facility] [place].  Her call back number is [phone]

## 2015-11-13 NOTE — Telephone Encounter (Signed)
Patient notified she needs to call pharmacy and have them fill rx. She does not need new rx.

## 2015-12-18 ENCOUNTER — Ambulatory Visit: Payer: 59 | Admitting: Family Medicine

## 2016-02-04 ENCOUNTER — Other Ambulatory Visit: Payer: Self-pay | Admitting: Family Medicine

## 2016-02-04 DIAGNOSIS — I10 Essential (primary) hypertension: Secondary | ICD-10-CM

## 2016-02-04 MED ORDER — LISINOPRIL 20 MG PO TABS: 20.0000 mg | ORAL_TABLET | Freq: Every day | ORAL | 0 refills | Status: DC

## 2016-05-09 ENCOUNTER — Other Ambulatory Visit: Payer: Self-pay | Admitting: Family Medicine

## 2016-05-09 DIAGNOSIS — I10 Essential (primary) hypertension: Secondary | ICD-10-CM

## 2016-05-13 ENCOUNTER — Ambulatory Visit: Payer: 59 | Admitting: Nurse Practitioner

## 2016-05-22 ENCOUNTER — Encounter: Payer: Self-pay | Admitting: Nurse Practitioner

## 2016-05-22 ENCOUNTER — Ambulatory Visit (INDEPENDENT_AMBULATORY_CARE_PROVIDER_SITE_OTHER): Payer: Self-pay | Admitting: Nurse Practitioner

## 2016-05-22 VITALS — BP 130/80 | HR 60 | Temp 98.4°F | Resp 16 | Ht 65.0 in | Wt 197.8 lb

## 2016-05-22 DIAGNOSIS — K219 Gastro-esophageal reflux disease without esophagitis: Secondary | ICD-10-CM

## 2016-05-22 DIAGNOSIS — I10 Essential (primary) hypertension: Secondary | ICD-10-CM

## 2016-05-22 DIAGNOSIS — E119 Type 2 diabetes mellitus without complications: Secondary | ICD-10-CM

## 2016-05-22 MED ORDER — LISINOPRIL 20 MG PO TABS: 20.0000 mg | ORAL_TABLET | Freq: Every day | ORAL | 0 refills | Status: DC

## 2016-05-22 MED ORDER — DAPAGLIFLOZIN PROPANEDIOL 5 MG PO TABS: 5.0000 mg | ORAL_TABLET | Freq: Every day | ORAL | 2 refills | Status: DC

## 2016-05-22 MED ORDER — AMLODIPINE BESYLATE 10 MG PO TABS: 10.0000 mg | ORAL_TABLET | Freq: Every day | ORAL | 3 refills | Status: DC

## 2016-05-22 NOTE — Progress Notes (Signed)
Subjective:    Patient ID: Angela Griffith, female    DOB: 1960-02-27, 56 y.o.   MRN: [id number]  Angela Griffith is a 56 y.o. female presenting on 05/22/2016 for Hypertension and Diabetes   HPI  Hypertension Checks BP at home usually around 120-130 / 80s She is tolerating medications well.  She takes lisinopril and amlodipine.  She occasionally misses her amlodipine if not at home with her meds when her alarm rings.  She has better BP control when she separates the doses with one in am and one in pm. Pt denies headache, lightheadedness, dizziness, changes in vision, chest tightness/pressure, sudden loss of speech or loss of consiousness. Occasional palpitations   GERD Acid reflux - changes diet and food to avoid triggers, not eating as late at night.  Spicy foods are eaten earlier in day.  Drinks lemon water.  Stress related onset, so works to manage stresses.  She is currently not taking any maintenance medications.  Diabetes Mellitus Checks BG sometime in am and readings are usually around 100.   She notes she has been eating more pasta and sweets.  Has started brown rice instead of white rice.  Cut down on sweets in recent weeks and is not eating them all day. Not many salads.   Last A1c in 08/2015 was 6.9  Pt states she knew it would be higher, but didn't expect it to be 8.0 today.  Not able to take metformin r/t GI side effects.  She did take it for 4 weeks before stopping r/t severe diarrhea and gas.  She states Januvia caused her to have hypoglycemia.  Pt denies polydipsia, polyphagia, polyuria, headaches, diaphoresis, chills, pain, numbness or tingling in extremities, and changes in vision. Endorses occasional shakiness if she hasn't eaten in a while.  This occurs about 2 times per week.  When it does, she knows her glucose is probably low, so she eats something.  She has checked her CBG when shaky and it is down to 70.  Significant maternal family hx of diabetes.      Social History  Substance Use Topics  . Smoking status: Former Smoker    Packs/day: 0.25    Years: 34.00    Types: Cigarettes    Quit date: 01/19/2014  . Smokeless tobacco: Never Used  . Alcohol use No    Review of Systems Per HPI unless specifically indicated above     Objective:    BP 130/80 (BP Location: Left Arm, Patient Position: Sitting, Cuff Size: Large)   Pulse 60   Temp 98.4 F (36.9 C) (Oral)   Resp 16   Ht 5\' 5"  (1.651 m)   Wt 197 lb 12.8 oz (89.7 kg)   SpO2 100%   BMI 32.92 kg/m   Wt Readings from Last 3 Encounters:  05/22/16 197 lb 12.8 oz (89.7 kg)  09/13/15 197 lb (89.4 kg)  03/08/15 184 lb 9.6 oz (83.7 kg)    Physical Exam  Constitutional: She is oriented to person, place, and time. She appears well-developed and well-nourished. No distress.  HENT:  Head: Normocephalic and atraumatic.  Right Ear: External ear normal.  Left Ear: External ear normal.  Mouth/Throat: Oropharynx is clear and moist.  Eyes: Conjunctivae, EOM and lids are normal. Pupils are equal, round, and reactive to light.  Fundoscopic exam:      The right eye shows no arteriolar narrowing, no AV nicking, no exudate, no hemorrhage and no papilledema. The right eye shows red reflex.  The left eye shows no arteriolar narrowing, no AV nicking, no exudate, no hemorrhage and no papilledema. The left eye shows red reflex.  Neck: Normal range of motion. Neck supple. No JVD present. No tracheal deviation present. No thyromegaly present.  No carotid bruits  Cardiovascular: Normal rate, regular rhythm, normal heart sounds and intact distal pulses.   Pulmonary/Chest: Effort normal and breath sounds normal.  Abdominal: Soft. Bowel sounds are normal. She exhibits no distension and no mass. There is no tenderness.  Lymphadenopathy:    She has no cervical adenopathy.  Neurological: She is alert and oriented to person, place, and time.  Skin: Skin is warm and dry.  Psychiatric: She has a  normal mood and affect. Her behavior is normal. Judgment and thought content normal.  Vitals reviewed.  Results for orders placed or performed in visit on 05/22/16  COMPLETE METABOLIC PANEL WITH GFR  Result Value Ref Range   Sodium 139 135 - 146 mmol/L   Potassium 4.3 3.5 - 5.3 mmol/L   Chloride 106 98 - 110 mmol/L   CO2 23 20 - 31 mmol/L   Glucose, Bld 122 (H) 65 - 99 mg/dL   BUN 14 7 - 25 mg/dL   Creat 0.96 0.45 - 4.09 mg/dL   Total Bilirubin 0.4 0.2 - 1.2 mg/dL   Alkaline Phosphatase 69 33 - 130 U/L   AST 13 10 - 35 U/L   ALT 14 6 - 29 U/L   Total Protein 6.5 6.1 - 8.1 g/dL   Albumin 3.7 3.6 - 5.1 g/dL   Calcium 9.3 8.6 - 81.1 mg/dL   GFR, Est African American >89 >=60 mL/min   GFR, Est Non African American >89 >=60 mL/min  Lipid Profile  Result Value Ref Range   Cholesterol 192 <200 mg/dL   Triglycerides 71 <914 mg/dL   HDL 49 (L) >78 mg/dL   Total CHOL/HDL Ratio 3.9 <5.0 Ratio   VLDL 14 <30 mg/dL   LDL Cholesterol 295 (H) <100 mg/dL  Hemoglobin A2Z  Result Value Ref Range   Hgb A1c MFr Bld 7.6 (H) <5.7 %   Mean Plasma Glucose 171 mg/dL      Assessment & Plan:   Problem List Items Addressed This Visit      Cardiovascular and Mediastinum   Hypertension - Primary    Controlled and stable.  Medications well tolerated despite some missed doses.  Plan: 1. Continue amlodipine 10 mg daily and lisinopril 20 mg daily 2. START Farxiga 5 mg once daily for DM and CV management. 3. Continue work on diet.  Discussed DASH diet.  Goal is to choose 1-2 items from the diet to incorporate each week until diet looks mostly like the DASH diet. 4. Increase physical activity to 30 minutes most days of the week.      Relevant Medications   dapagliflozin propanediol (FARXIGA) 5 MG TABS tablet   amLODipine (NORVASC) 10 MG tablet   lisinopril (PRINIVIL,ZESTRIL) 20 MG tablet   Other Relevant Orders   COMPLETE METABOLIC PANEL WITH GFR (Completed)   Lipid Profile (Completed)      Digestive   GERD without esophagitis    Managed with diet and lifestyle changes.  Plan: 1. Continue current work to avoid triggers.   2. Encouraged weight loss of 10-15 lbs for improved symptoms.        Endocrine   Diabetes mellitus without complication (HCC)    Worsening.  Now above goal A1c.  Plan: 1. Lifestyle modifications.  DASH diet  will assist glucose control as well.  Increase physical activity to 30 minutes most days of the week. 2. START taking Farxiga 5 mg once daily.  Check CMP for kidney function. 3. Lipid panel. Initiate statin depending on results.  Shared ASCVD risk calculations with patient from old lab values.  Reluctant to start medications, so she states she will work on lifestyle first.      Relevant Medications   dapagliflozin propanediol (FARXIGA) 5 MG TABS tablet   lisinopril (PRINIVIL,ZESTRIL) 20 MG tablet   Other Relevant Orders   POCT glycosylated hemoglobin (Hb A1C)   COMPLETE METABOLIC PANEL WITH GFR (Completed)   Lipid Profile (Completed)   Hemoglobin A1c (Completed)      Meds ordered this encounter  Medications  . dapagliflozin propanediol (FARXIGA) 5 MG TABS tablet    Sig: Take 5 mg by mouth daily.    Dispense:  30 tablet    Refill:  2    Order Specific Question:   Supervising Provider    Answer:   Smitty Cords [[id number]]  . amLODipine (NORVASC) 10 MG tablet    Sig: Take 1 tablet (10 mg total) by mouth daily.    Dispense:  90 tablet    Refill:  3    Order Specific Question:   Supervising Provider    Answer:   Smitty Cords [[id number]]  . lisinopril (PRINIVIL,ZESTRIL) 20 MG tablet    Sig: Take 1 tablet (20 mg total) by mouth daily.    Dispense:  90 tablet    Refill:  0    Need to contact office for appointment NO REFILLS.    Order Specific Question:   Supervising Provider    Answer:   Smitty Cords [[id number]]     Follow up plan: Return in about 3 months (around 08/22/2016).   [name], DNP,  AGPCNP-BC Adult Gerontology Primary Care Nurse Practitioner [facility] 05/23/2016, 7:03 PM

## 2016-05-22 NOTE — Patient Instructions (Addendum)
Angela Griffith, Thank you for coming in to clinic today.  1. For your hypertension, continue your current medications without changes. - Focus on healthier eating with the DASH eating plan. - Increasing physical activity will also help your heart and blood sugar.  Here is the risk tool we discussed.  It can help [place] decide what medicines are going to be most helpful for preventing your risk of heart attack and stroke. http://tools.http://www.davila-james.org/  2. For your diabetes: - START watching your carbohydrate intake.  Eat more whole grains with higher dietary fiber. - START farxiga 5 mg once daily.  - Your A1c was higher.  We will verify this with a lab draw. - We will also check your kidney function and cholesterol.  Please schedule a follow-up appointment with [name], AGNP in 3 months.  If you have any other questions or concerns, please feel free to call the clinic or send a message through MyChart. You may also schedule an earlier appointment if necessary.  [name], DNP, AGNP-BC Adult Gerontology Nurse Practitioner [facility], [facility]    DASH Eating Plan DASH stands for "Dietary Approaches to Stop Hypertension." The DASH eating plan is a healthy eating plan that has been shown to reduce high blood pressure (hypertension). It may also reduce your risk for type 2 diabetes, heart disease, and stroke. The DASH eating plan may also help with weight loss. What are tips for following this plan? General guidelines   Avoid eating more than 2,300 mg (milligrams) of salt (sodium) a day. If you have hypertension, you may need to reduce your sodium intake to 1,500 mg a day.  Limit alcohol intake to no more than 1 drink a day for nonpregnant women and 2 drinks a day for men. One drink equals 12 oz of beer, 5 oz of wine, or 1 oz of hard liquor.  Work with your health care provider to maintain a healthy body weight or to lose weight. Ask what an ideal weight  is for you.  Get at least 30 minutes of exercise that causes your heart to beat faster (aerobic exercise) most days of the week. Activities may include walking, swimming, or biking.  Work with your health care provider or diet and nutrition specialist (dietitian) to adjust your eating plan to your individual calorie needs. Reading food labels   Check food labels for the amount of sodium per serving. Choose foods with less than 5 percent of the Daily Value of sodium. Generally, foods with less than 300 mg of sodium per serving fit into this eating plan.  To find whole grains, look for the word "whole" as the first word in the ingredient list. Shopping   Buy products labeled as "low-sodium" or "no salt added."  Buy fresh foods. Avoid canned foods and premade or frozen meals. Cooking   Avoid adding salt when cooking. Use salt-free seasonings or herbs instead of table salt or sea salt. Check with your health care provider or pharmacist before using salt substitutes.  Do not fry foods. Cook foods using healthy methods such as baking, boiling, grilling, and broiling instead.  Cook with heart-healthy oils, such as olive, canola, soybean, or sunflower oil. Meal planning    Eat a balanced diet that includes:  5 or more servings of fruits and vegetables each day. At each meal, try to fill half of your plate with fruits and vegetables.  Up to 6-8 servings of whole grains each day.  Less than 6 oz of lean  meat, poultry, or fish each day. A 3-oz serving of meat is about the same size as a deck of cards. One egg equals 1 oz.  2 servings of low-fat dairy each day.  A serving of nuts, seeds, or beans 5 times each week.  Heart-healthy fats. Healthy fats called Omega-3 fatty acids are found in foods such as flaxseeds and coldwater fish, like sardines, salmon, and mackerel.  Limit how much you eat of the following:  Canned or prepackaged foods.  Food that is high in trans fat, such as fried  foods.  Food that is high in saturated fat, such as fatty meat.  Sweets, desserts, sugary drinks, and other foods with added sugar.  Full-fat dairy products.  Do not salt foods before eating.  Try to eat at least 2 vegetarian meals each week.  Eat more home-cooked food and less restaurant, buffet, and fast food.  When eating at a restaurant, ask that your food be prepared with less salt or no salt, if possible. What foods are recommended? The items listed may not be a complete list. Talk with your dietitian about what dietary choices are best for you. Grains  Whole-grain or whole-wheat bread. Whole-grain or whole-wheat pasta. Brown rice. Orpah Cobb. Bulgur. Whole-grain and low-sodium cereals. Pita bread. Low-fat, low-sodium crackers. Whole-wheat flour tortillas. Vegetables  Fresh or frozen vegetables (raw, steamed, roasted, or grilled). Low-sodium or reduced-sodium tomato and vegetable juice. Low-sodium or reduced-sodium tomato sauce and tomato paste. Low-sodium or reduced-sodium canned vegetables. Fruits  All fresh, dried, or frozen fruit. Canned fruit in natural juice (without added sugar). Meat and other protein foods  Skinless chicken or [place]. Ground chicken or [place]. Pork with fat trimmed off. Fish and seafood. Egg whites. Dried beans, peas, or lentils. Unsalted nuts, nut butters, and seeds. Unsalted canned beans. Lean cuts of beef with fat trimmed off. Low-sodium, lean deli meat. Dairy  Low-fat (1%) or fat-free (skim) milk. Fat-free, low-fat, or reduced-fat cheeses. Nonfat, low-sodium ricotta or cottage cheese. Low-fat or nonfat yogurt. Low-fat, low-sodium cheese. Fats and oils  Soft margarine without trans fats. Vegetable oil. Low-fat, reduced-fat, or light mayonnaise and salad dressings (reduced-sodium). Canola, safflower, olive, soybean, and sunflower oils. Avocado. Seasoning and other foods  Herbs. Spices. Seasoning mixes without salt. Unsalted popcorn and pretzels.  Fat-free sweets. What foods are not recommended? The items listed may not be a complete list. Talk with your dietitian about what dietary choices are best for you. Grains  Baked goods made with fat, such as croissants, muffins, or some breads. Dry pasta or rice meal packs. Vegetables  Creamed or fried vegetables. Vegetables in a cheese sauce. Regular canned vegetables (not low-sodium or reduced-sodium). Regular canned tomato sauce and paste (not low-sodium or reduced-sodium). Regular tomato and vegetable juice (not low-sodium or reduced-sodium). Rosita Fire. Olives. Fruits  Canned fruit in a light or heavy syrup. Fried fruit. Fruit in cream or butter sauce. Meat and other protein foods  Fatty cuts of meat. Ribs. Fried meat. Tomasa Blase. Sausage. Bologna and other processed lunch meats. Salami. Fatback. Hotdogs. Bratwurst. Salted nuts and seeds. Canned beans with added salt. Canned or smoked fish. Whole eggs or egg yolks. Chicken or [place] with skin. Dairy  Whole or 2% milk, cream, and half-and-half. Whole or full-fat cream cheese. Whole-fat or sweetened yogurt. Full-fat cheese. Nondairy creamers. Whipped toppings. Processed cheese and cheese spreads. Fats and oils  Butter. Stick margarine. Lard. Shortening. Ghee. Bacon fat. Tropical oils, such as coconut, palm kernel, or palm oil. Seasoning and other  foods  Salted popcorn and pretzels. Onion salt, garlic salt, seasoned salt, table salt, and sea salt. Worcestershire sauce. Tartar sauce. Barbecue sauce. Teriyaki sauce. Soy sauce, including reduced-sodium. Steak sauce. Canned and packaged gravies. Fish sauce. Oyster sauce. Cocktail sauce. Horseradish that you find on the shelf. Ketchup. Mustard. Meat flavorings and tenderizers. Bouillon cubes. Hot sauce and Tabasco sauce. Premade or packaged marinades. Premade or packaged taco seasonings. Relishes. Regular salad dressings. Where to find more information:  National Heart, Lung, and Blood Institute:  PopSteam.is  American Heart Association: www.heart.org Summary  The DASH eating plan is a healthy eating plan that has been shown to reduce high blood pressure (hypertension). It may also reduce your risk for type 2 diabetes, heart disease, and stroke.  With the DASH eating plan, you should limit salt (sodium) intake to 2,300 mg a day. If you have hypertension, you may need to reduce your sodium intake to 1,500 mg a day.  When on the DASH eating plan, aim to eat more fresh fruits and vegetables, whole grains, lean proteins, low-fat dairy, and heart-healthy fats.  Work with your health care provider or diet and nutrition specialist (dietitian) to adjust your eating plan to your individual calorie needs. This information is not intended to replace advice given to you by your health care provider. Make sure you discuss any questions you have with your health care provider. Document Released: 12/25/2010 Document Revised: 12/30/2015 Document Reviewed: 12/30/2015 Elsevier Interactive Patient Education  2017 Elsevier Inc.   Dapagliflozin tablets What is this medicine? DAPAGLIFLOZIN (DAP a gli FLOE zin) helps to treat type 2 diabetes. It helps to control blood sugar. Treatment is combined with diet and exercise. This medicine may be used for other purposes; ask your health care provider or pharmacist if you have questions. COMMON BRAND NAME(S): Marcelline Deist What should I tell my health care provider before I take this medicine? They need to know if you have any of these conditions: -bladder cancer -dehydration -diabetic ketoacidosis -diet low in salt -eating less due to illness, surgery, dieting, or any other reason -having surgery -high cholesterol -history of pancreatitis or pancreas problems -history of yeast infection of the penis or vagina -if you often drink alcohol -infections in the bladder, kidneys, or urinary tract -kidney disease -low blood pressure -on  hemodialysis -problems urinating -type 1 diabetes -uncircumcised female -an unusual or allergic reaction to dapagliflozin, other medicines, foods, dyes, or preservatives -pregnant or trying to get pregnant -breast-feeding How should I use this medicine? Take this medicine by mouth with a glass of water. Follow the directions on the prescription label. You can take it with or without food. If it upsets your stomach, take it with food. Take this medicine in the morning. Take your dose at the same time each day. Do not take more often than directed. Do not stop taking except on your doctor's advice. A special MedGuide will be given to you by the pharmacist with each prescription and refill. Be sure to read this information carefully each time. Talk to your pediatrician regarding the use of this medicine in children. Special care may be needed. Overdosage: If you think you have taken too much of this medicine contact a poison control center or emergency room at once. NOTE: This medicine is only for you. Do not share this medicine with others. What if I miss a dose? If you miss a dose, take it as soon as you can. If it is almost time for your next dose, take only  that dose. Do not take double or extra doses. What may interact with this medicine? Do not take this medicine with any of the following medications: -gatifloxacin This medicine may also interact with the following medications: -alcohol -certain medicines for blood pressure, heart disease -diuretics -insulin -nateglinide -pioglitazone -quinolone antibiotics like ciprofloxacin, levofloxacin, ofloxacin -repaglinide -some herbal dietary supplements -steroid medicines like prednisone or cortisone -sulfonylureas like glimepiride, glipizide, glyburide -thyroid medicine This list may not describe all possible interactions. Give your health care provider a list of all the medicines, herbs, non-prescription drugs, or dietary supplements you  use. Also tell them if you smoke, drink alcohol, or use illegal drugs. Some items may interact with your medicine. What should I watch for while using this medicine? Visit your doctor or health care professional for regular checks on your progress. This medicine can cause a serious condition in which there is too much acid in the blood. If you develop nausea, vomiting, stomach pain, unusual tiredness, or breathing problems, stop taking this medicine and call your doctor right away. If possible, use a ketone dipstick to check for ketones in your urine. A test called the HbA1C (A1C) will be monitored. This is a simple blood test. It measures your blood sugar control over the last 2 to 3 months. You will receive this test every 3 to 6 months. Learn how to check your blood sugar. Learn the symptoms of low and high blood sugar and how to manage them. Always carry a quick-source of sugar with you in case you have symptoms of low blood sugar. Examples include hard sugar candy or glucose tablets. Make sure others know that you can choke if you eat or drink when you develop serious symptoms of low blood sugar, such as seizures or unconsciousness. They must get medical help at once. Tell your doctor or health care professional if you have high blood sugar. You might need to change the dose of your medicine. If you are sick or exercising more than usual, you might need to change the dose of your medicine. Do not skip meals. Ask your doctor or health care professional if you should avoid alcohol. Many nonprescription cough and cold products contain sugar or alcohol. These can affect blood sugar. Wear a medical ID bracelet or chain, and carry a card that describes your disease and details of your medicine and dosage times. What side effects may I notice from receiving this medicine? Side effects that you should report to your doctor or health care professional as soon as possible: -allergic reactions like skin rash,  itching or hives, swelling of the face, lips, or tongue -breathing problems -dizziness -feeling faint or lightheaded, falls -muscle weakness -nausea, vomiting, unusual stomach upset or pain -signs and symptoms of low blood sugar such as feeling anxious, confusion, dizziness, increased hunger, unusually weak or tired, sweating, shakiness, cold, irritable, headache, blurred vision, fast heartbeat, loss of consciousness -signs and symptoms of a urinary tract infection, such as fever, chills, a burning feeling when urinating, blood in the urine, back pain -trouble passing urine or change in the amount of urine, including an urgent need to urinate more often, in larger amounts, or at night -penile discharge, itching, or pain in men -unusual tiredness -vaginal discharge, itching, or odor in women Side effects that usually do not require medical attention (report to your doctor or health care professional if they continue or are bothersome): -constipation -mild increase in urination -stuffy or runny nose -sore throat -thirsty This list may not  describe all possible side effects. Call your doctor for medical advice about side effects. You may report side effects to FDA at 1-800-FDA-1088. Where should I keep my medicine? Keep out of the reach of children. Store at room temperature between 15 and 30 degrees C (59 and 86 degrees F). Throw away any unused medicine after the expiration date. NOTE: This sheet is a summary. It may not cover all possible information. If you have questions about this medicine, talk to your doctor, pharmacist, or health care provider.  2018 Elsevier/Gold Standard (2015-02-07 08:46:40)

## 2016-05-23 LAB — HEMOGLOBIN A1C
Hgb A1c MFr Bld: 7.6 % — ABNORMAL HIGH (ref <5.7)
Mean Plasma Glucose: 171 mg/dL

## 2016-05-23 LAB — COMPLETE METABOLIC PANEL WITH GFR
ALT: 14 U/L (ref 6–29)
AST: 13 U/L (ref 10–35)
Albumin: 3.7 g/dL (ref 3.6–5.1)
Alkaline Phosphatase: 69 U/L (ref 33–130)
BUN: 14 mg/dL (ref 7–25)
CO2: 23 mmol/L (ref 20–31)
Calcium: 9.3 mg/dL (ref 8.6–10.4)
Chloride: 106 mmol/L (ref 98–110)
Creat: 0.75 mg/dL (ref 0.50–1.05)
GFR, Est African American: >89 mL/min (ref >=60)
GFR, Est Non African American: >89 mL/min (ref >=60)
Glucose, Bld: 122 mg/dL — ABNORMAL HIGH (ref 65–99)
Potassium: 4.3 mmol/L (ref 3.5–5.3)
Sodium: 139 mmol/L (ref 135–146)
Total Bilirubin: 0.4 mg/dL (ref 0.2–1.2)
Total Protein: 6.5 g/dL (ref 6.1–8.1)

## 2016-05-23 LAB — LIPID PANEL
Cholesterol: 192 mg/dL (ref <200)
HDL: 49 mg/dL — ABNORMAL LOW (ref >50)
LDL Cholesterol: 129 mg/dL — ABNORMAL HIGH (ref <100)
Total CHOL/HDL Ratio: 3.9 Ratio (ref <5.0)
Triglycerides: 71 mg/dL (ref <150)
VLDL: 14 mg/dL (ref <30)

## 2016-05-23 NOTE — Assessment & Plan Note (Signed)
Managed with diet and lifestyle changes.  Plan: 1. Continue current work to avoid triggers.   2. Encouraged weight loss of 10-15 lbs for improved symptoms.

## 2016-05-23 NOTE — Assessment & Plan Note (Signed)
Controlled and stable.  Medications well tolerated despite some missed doses.  Plan: 1. Continue amlodipine 10 mg daily and lisinopril 20 mg daily 2. START Farxiga 5 mg once daily for DM and CV management. 3. Continue work on diet.  Discussed DASH diet.  Goal is to choose 1-2 items from the diet to incorporate each week until diet looks mostly like the DASH diet. 4. Increase physical activity to 30 minutes most days of the week.

## 2016-05-23 NOTE — Assessment & Plan Note (Signed)
Worsening.  Now above goal A1c.  Plan: 1. Lifestyle modifications.  DASH diet will assist glucose control as well.  Increase physical activity to 30 minutes most days of the week. 2. START taking Farxiga 5 mg once daily.  Check CMP for kidney function. 3. Lipid panel. Initiate statin depending on results.  Shared ASCVD risk calculations with patient from old lab values.  Reluctant to start medications, so she states she will work on lifestyle first.

## 2016-05-24 NOTE — Progress Notes (Signed)
I have reviewed this encounter including the documentation in this note and/or discussed this patient with the provider, Wilhelmina McardleLauren Kennedy, AGPCNP-BC. I am certifying that I agree with the content of this note as supervising physician.  Saralyn PilarAlexander Karamalegos, DO [facility] 05/24/2016, 10:07 AM

## 2016-05-26 ENCOUNTER — Telehealth: Payer: Self-pay | Admitting: Family Medicine

## 2016-05-26 NOTE — Telephone Encounter (Signed)
Received call from After Hours On Call nursing triage approx 11:50pm on 05/26/16, regarding Teachers Insurance and Annuity AssociationBettye Griffith. Patient called after hours line regarding accidentally taking a repeat dose of her Lisinopril in evening, she took Lisinopril 20mg  in AM and instead of taking Amlodipine 10mg  in PM, she took a second dose of Lisinopril 20mg . She was asymptomatic, but called nursing triage.  From my chart review, seems she had been on ACEi Lisinopril for a while, and this was not a recent addition. Her recent blood work 05/22/16 had normal Cr and K. Total daily dose of Lisinopril 40mg  is no concern at this time especially if she had no symptoms.  Reassurance given to nurse to relay to patient. Continue to hold Amlodipine tonight. She may resume regular dosing starting tomorrow with Lisinopril 20mg  in AM and Amlodipine 10mg  in evening.  Will forward note to PCP Angela Griffith, Angela Griffith  Angela PilarAlexander Karamalegos, DO [facility] 05/26/2016, 11:59 PM

## 2016-08-17 ENCOUNTER — Other Ambulatory Visit: Payer: Self-pay | Admitting: Nurse Practitioner

## 2016-08-17 DIAGNOSIS — I10 Essential (primary) hypertension: Secondary | ICD-10-CM

## 2016-08-24 ENCOUNTER — Ambulatory Visit: Payer: 59 | Admitting: Nurse Practitioner

## 2016-08-31 ENCOUNTER — Encounter: Payer: Self-pay | Admitting: Nurse Practitioner

## 2016-08-31 ENCOUNTER — Ambulatory Visit (INDEPENDENT_AMBULATORY_CARE_PROVIDER_SITE_OTHER): Payer: Managed Care, Other (non HMO) | Admitting: Nurse Practitioner

## 2016-08-31 VITALS — BP 136/81 | HR 72 | Temp 98.2°F | Ht 65.0 in | Wt 196.8 lb

## 2016-08-31 DIAGNOSIS — E119 Type 2 diabetes mellitus without complications: Secondary | ICD-10-CM | POA: Diagnosis not present

## 2016-08-31 DIAGNOSIS — I10 Essential (primary) hypertension: Secondary | ICD-10-CM

## 2016-08-31 LAB — POCT GLYCOSYLATED HEMOGLOBIN (HGB A1C): Hemoglobin A1C: 7.7

## 2016-08-31 MED ORDER — GLUCOSE BLOOD VI STRP: ORAL_STRIP | 12 refills | Status: DC

## 2016-08-31 NOTE — Assessment & Plan Note (Signed)
Uncontrolled and increased by 0.1% since last visit despite pt making perceived dietary improvements.  Altered perception of disease process w/ belief of cure via spiritual miracle.  Plan: 1. Discussion of diabetes pathophysiology, disease process, and unlikely cure of diabetes.  After discussion, pt states is willing to reframe spiritual beliefs and start medication. 2. Lifestyle modifications.  DASH diet will assist glucose control as well.  Increase physical activity to 30 minutes most days of the week. 3. START taking Farxiga 5 mg once daily.  Kidney function normal in May 2018. Discussed side effect of possible increase in UTI/Candidiasis.  Discussed unlikely hypoglycemia.  Pt agrees to start medication. 4. Start checking CBG fasting in am daily.  Reordered strips. 5. Follow up 3 months.

## 2016-08-31 NOTE — Assessment & Plan Note (Signed)
Controlled and stable.  Medications well tolerated despite some missed doses.  Plan: 1. Continue amlodipine 10 mg daily and lisinopril 20 mg daily 2. Continue work on diet.  Discussed DASH diet.  Goal is to choose 1-2 items from the diet to incorporate each week until diet looks mostly like the DASH diet. 3. Increase physical activity to 30 minutes most days of the week. 4. Follow up 3 months

## 2016-08-31 NOTE — Patient Instructions (Addendum)
[name], Thank you for coming in to clinic today.  1. For your diabetes: - START taking Farxiga 5 mg once daily.   2. For your blood pressure: - GREAT WORK!! - No changes to these medicines.  Please schedule a follow-up appointment with Wilhelmina McardleLauren Eadie Repetto, AGNP. Return in about 3 months (around 12/01/2016) for diabetes.  If you have any other questions or concerns, please feel free to call the clinic or send a message through MyChart. You may also schedule an earlier appointment if necessary.  You will receive a survey after today's visit either digitally by e-mail or paper by [employer] mail. Your experiences and feedback matter to us.  Please respond so we know how we are doing as we provide care for you.   Wilhelmina McardleLauren Rakeisha Nyce, DNP, AGNP-BC Adult Gerontology Nurse Practitioner [facility], [facility]

## 2016-08-31 NOTE — Progress Notes (Signed)
I have reviewed this encounter including the documentation in this note and/or discussed this patient with the provider, Wilhelmina McardleLauren Kennedy, AGPCNP-BC. I am certifying that I agree with the content of this note as supervising physician.  Saralyn PilarAlexander Reeda Soohoo, DO [facility] 08/31/2016, 5:41 PM

## 2016-08-31 NOTE — Progress Notes (Signed)
Subjective:    Patient ID: Angela Griffith, female    DOB: [birth year], 56 y.o.   MRN: [id number]  Angela Griffith is a 56 y.o. female presenting on 08/31/2016 for Hypertension and Diabetes   HPI Diabetes Pt presents today for follow up of Type 2 diabetes mellitus. She is not checking CBG at home - Current diabetic medications include: none  Pt did not start [place].  Pt believes if she can cut back on sugar/carbs she won't need medications.  Pt also expresses religious faith that God will heal her diabetes without medications.   - She has symptoms of increased appetite, but also has cravings more at night w/ 3rd shift work. She denies polydipsia, polyphagia, polyuria, headaches, diaphoresis, shakiness, chills, pain, numbness or tingling in extremities and changes in vision.  Clinical course has been stable. - Weight trend: stable - Is improving w/ less sugary beverage intake, making small carbohydrate improvements in diet w/ reducing sweets and added sugars.  Substituting w/ pasta/pizza, however.  PREVENTION: Eye exam current (within one year): no - Last in February 2017 Foot exam current (within one year): due today  Recent Labs  09/13/15 1004 05/22/16 1041 08/31/16 1532  HGBA1C 6.9* 7.6* 7.7     Hypertension - She is checking BP at home or outside of clinic.  Readings 150/90 and unsure of accuracy  Off 20 points from prior reading. - Current medications: lisinopril and amlodipine, tolerating well without side effects - Pt denies headache, lightheadedness, dizziness, changes in vision, chest tightness/pressure, palpitations, leg swelling, sudden loss of speech or loss of consciousness. - She  reports no regular exercise routine. - Her diet is moderate in salt, moderate in fat, and moderate in carbohydrates.   Social History  Substance Use Topics  . Smoking status: Former Smoker    Packs/day: 0.25    Years: 34.00    Types: Cigarettes    Quit date: 01/19/2014  .  Smokeless tobacco: Never Used  . Alcohol use No    Review of Systems Per HPI unless specifically indicated above     Objective:    BP 136/81 (BP Location: Right Arm, Patient Position: Sitting, Cuff Size: Normal)   Pulse 72   Temp 98.2 F (36.8 C) (Oral)   Ht 5\' 5"  (1.651 m)   Wt 196 lb 12.8 oz (89.3 kg)   BMI 32.75 kg/m   Wt Readings from Last 3 Encounters:  08/31/16 196 lb 12.8 oz (89.3 kg)  05/22/16 197 lb 12.8 oz (89.7 kg)  09/13/15 197 lb (89.4 kg)    Physical Exam  General - overweight, well-appearing, NAD HEENT - Normocephalic, atraumatic Neck - supple, non-tender, no LAD Heart - RRR, no murmurs heard Lungs - Clear throughout all lobes, no wheezing, crackles, or rhonchi. Normal work of breathing. Extremeties - non-tender, no edema, cap refill < 2 seconds, peripheral pulses intact +2 bilaterally Skin - warm, dry Neuro - awake, alert, oriented x3, normal gait Psych - Normal mood and affect, normal behavior   Diabetic Foot Exam - Simple   Simple Foot Form Diabetic Foot exam was performed with the following findings:  Yes 08/31/2016  3:30 PM  Visual Inspection No deformities, no ulcerations, no other skin breakdown bilaterally:  Yes Sensation Testing Intact to touch and monofilament testing bilaterally:  Yes Pulse Check Posterior Tibialis and Dorsalis pulse intact bilaterally:  Yes Comments     Results for orders placed or performed in visit on 08/31/16  POCT glycosylated hemoglobin (Hb A1C)  Result Value Ref Range   Hemoglobin A1C 7.7       Assessment & Plan:   Problem List Items Addressed This Visit      Cardiovascular and Mediastinum   Hypertension    Controlled and stable.  Medications well tolerated despite some missed doses.  Plan: 1. Continue amlodipine 10 mg daily and lisinopril 20 mg daily 2. Continue work on diet.  Discussed DASH diet.  Goal is to choose 1-2 items from the diet to incorporate each week until diet looks mostly like the DASH  diet. 3. Increase physical activity to 30 minutes most days of the week. 4. Follow up 3 months        Endocrine   Diabetes mellitus without complication (HCC) - Primary    Uncontrolled and increased by 0.1% since last visit despite pt making perceived dietary improvements.  Altered perception of disease process w/ belief of cure via spiritual miracle.  Plan: 1. Discussion of diabetes pathophysiology, disease process, and unlikely cure of diabetes.  After discussion, pt states is willing to reframe spiritual beliefs and start medication. 2. Lifestyle modifications.  DASH diet will assist glucose control as well.  Increase physical activity to 30 minutes most days of the week. 3. START taking Farxiga 5 mg once daily.  Kidney function normal in May 2018. Discussed side effect of possible increase in UTI/Candidiasis.  Discussed unlikely hypoglycemia.  Pt agrees to start medication. 4. Start checking CBG fasting in am daily.  Reordered strips. 5. Follow up 3 months.      Relevant Medications   glucose blood test strip   Other Relevant Orders   POCT glycosylated hemoglobin (Hb A1C) (Completed)      Meds ordered this encounter  Medications  . glucose blood test strip    Sig: Use as instructed    Dispense:  50 each    Refill:  12    Product selection permitted for brand to match meter brand    Order Specific Question:   Supervising Provider    Answer:   Smitty CordsKARAMALEGOS, ALEXANDER J [[id number]]     Follow up plan: Return in about 3 months (around 12/01/2016) for diabetes.   Angela McardleLauren Zeppelin Commisso, DNP, AGPCNP-BC Adult Gerontology Primary Care Nurse Practitioner [facility] 08/31/2016, 5:19 PM

## 2016-09-01 ENCOUNTER — Telehealth: Payer: Self-pay | Admitting: Nurse Practitioner

## 2016-09-01 MED ORDER — GLUCOSE BLOOD VI STRP: ORAL_STRIP | 12 refills | Status: DC

## 2016-09-01 NOTE — Telephone Encounter (Signed)
Pt uses Free Style Freedom Lite meter and Free Style Lite strips.  She needs a ref[place] on [facility] [place].  Her call back number is [phone]

## 2016-11-07 IMAGING — CR DG CHEST 2V
2 series · 2 of 2 positions shown · non-contrast
Comparison: Chest radiograph from 07/13/2006

CLINICAL DATA: Acute onset of upper abdominal and chest throbbing.
Palpitations. Initial encounter.

EXAM:
CHEST  2 VIEW

[chest pa]
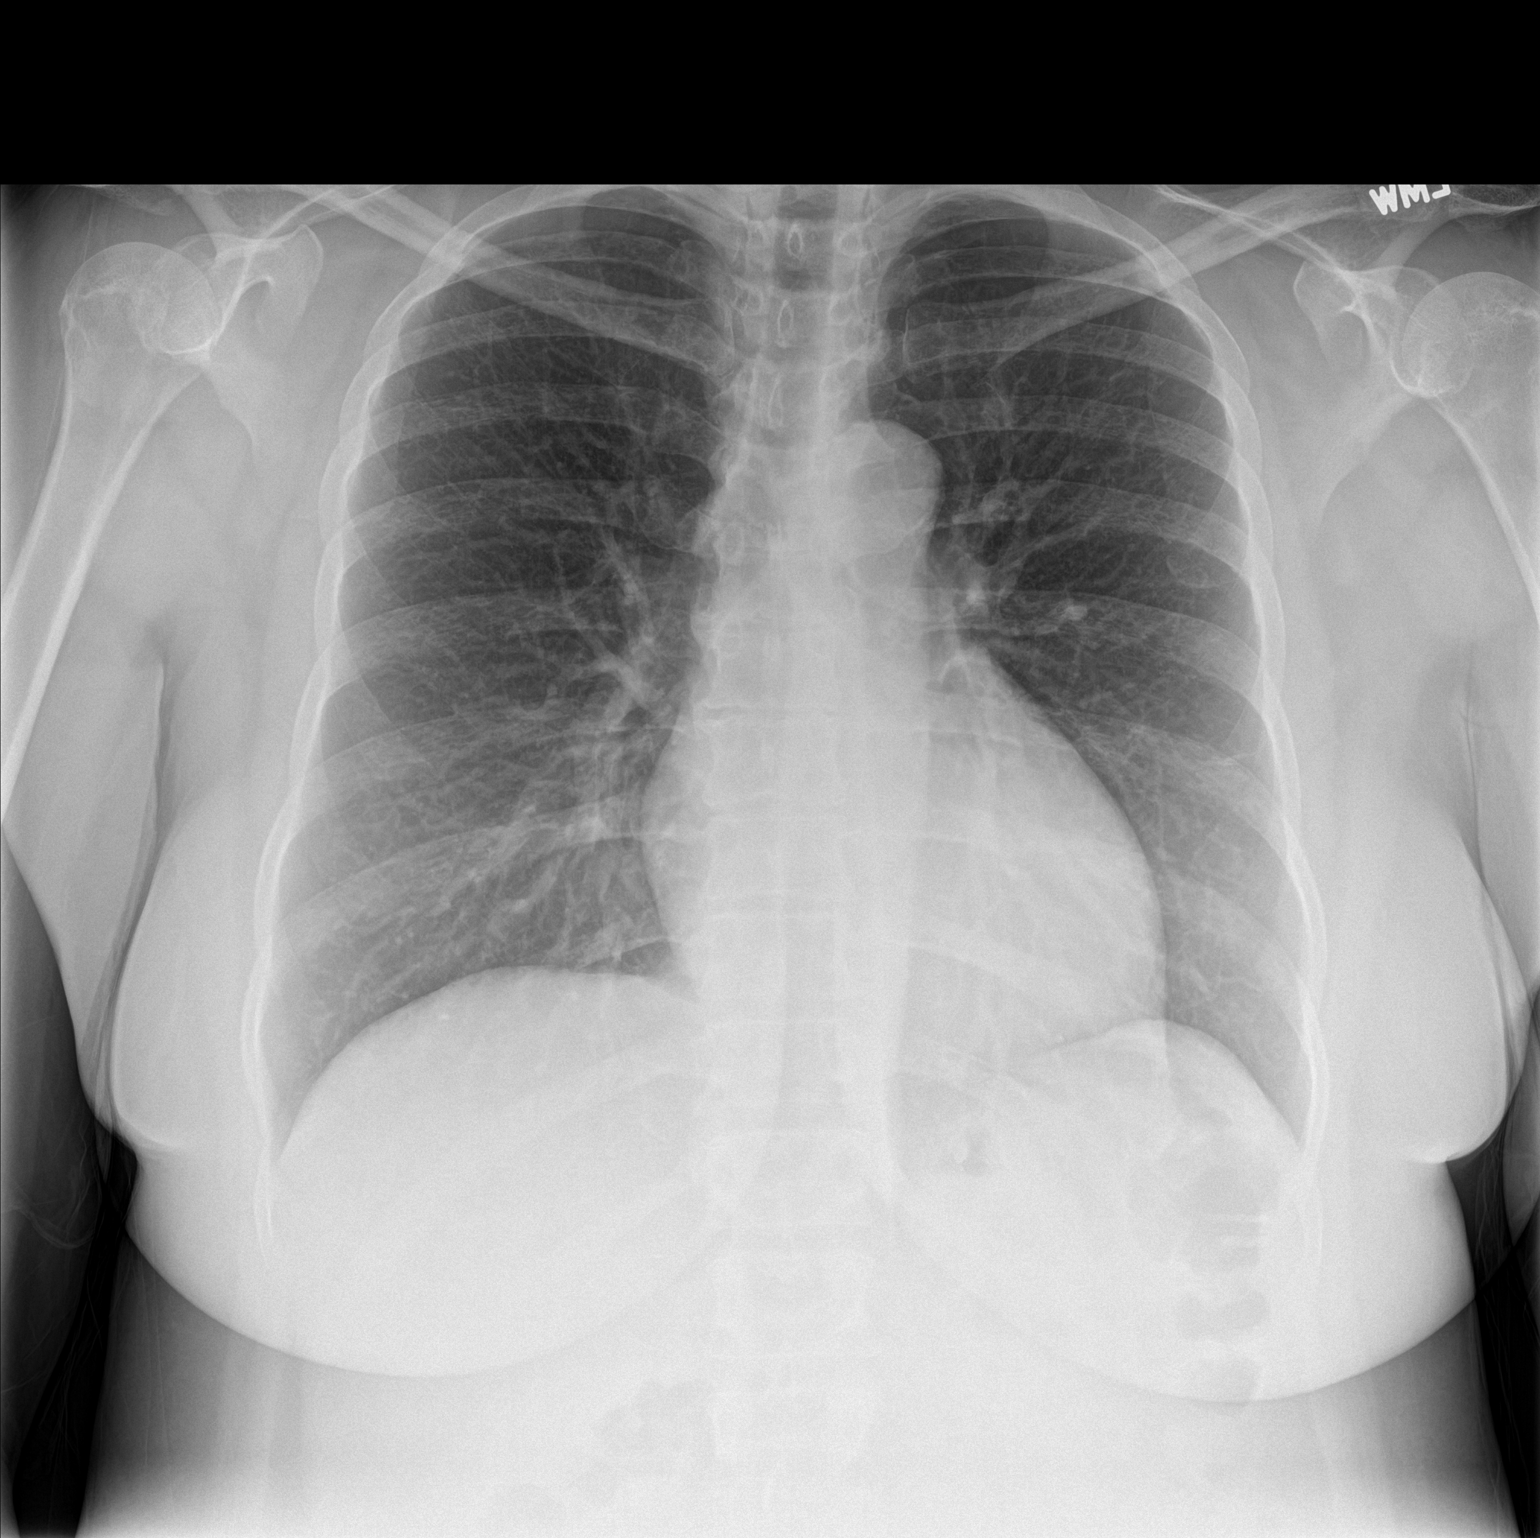

[chest lat]
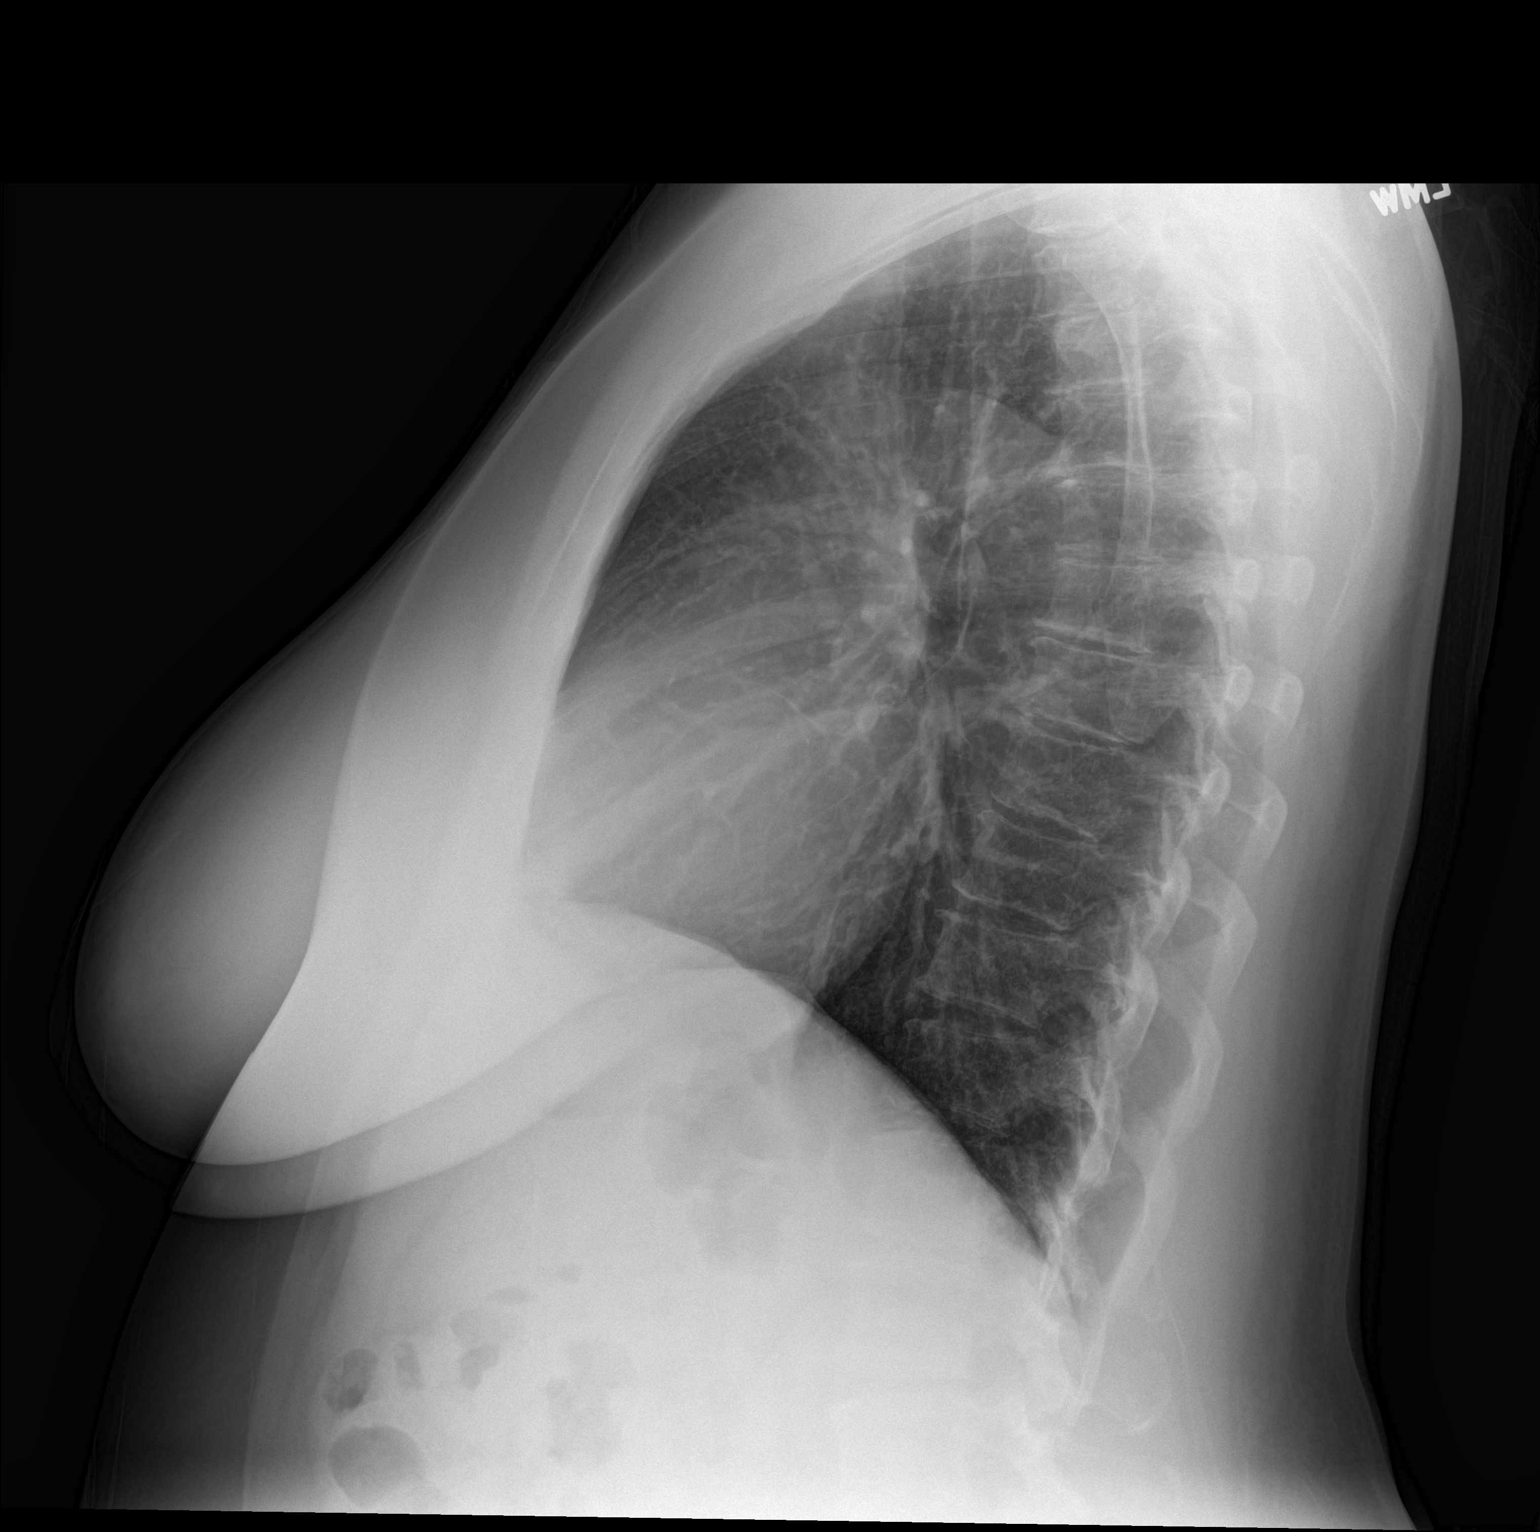

[2 of 2 positions shown; findings below may reference images not displayed]

FINDINGS: The lungs are well-aerated and clear. There is no evidence of focal
opacification, pleural effusion or pneumothorax.

The heart is normal in size; the mediastinal contour is within
normal limits. No acute osseous abnormalities are seen.
IMPRESSION: No acute cardiopulmonary process seen.

## 2016-11-24 ENCOUNTER — Other Ambulatory Visit: Payer: Self-pay | Admitting: Nurse Practitioner

## 2016-11-24 DIAGNOSIS — I10 Essential (primary) hypertension: Secondary | ICD-10-CM

## 2016-12-08 ENCOUNTER — Ambulatory Visit: Payer: Managed Care, Other (non HMO) | Admitting: Nurse Practitioner

## 2016-12-09 ENCOUNTER — Ambulatory Visit: Payer: Managed Care, Other (non HMO) | Admitting: Nurse Practitioner

## 2016-12-22 IMAGING — CR DG CHEST 2V
2 series · 2 of 2 positions shown · non-contrast
Comparison: 10/23/2014

CLINICAL DATA: Abdominal pain for 4 months, hiatal hernia, anxious,
weakness, history GERD, hypertension, diabetes mellitus

EXAM:
CHEST  2 VIEW

[chest pa]
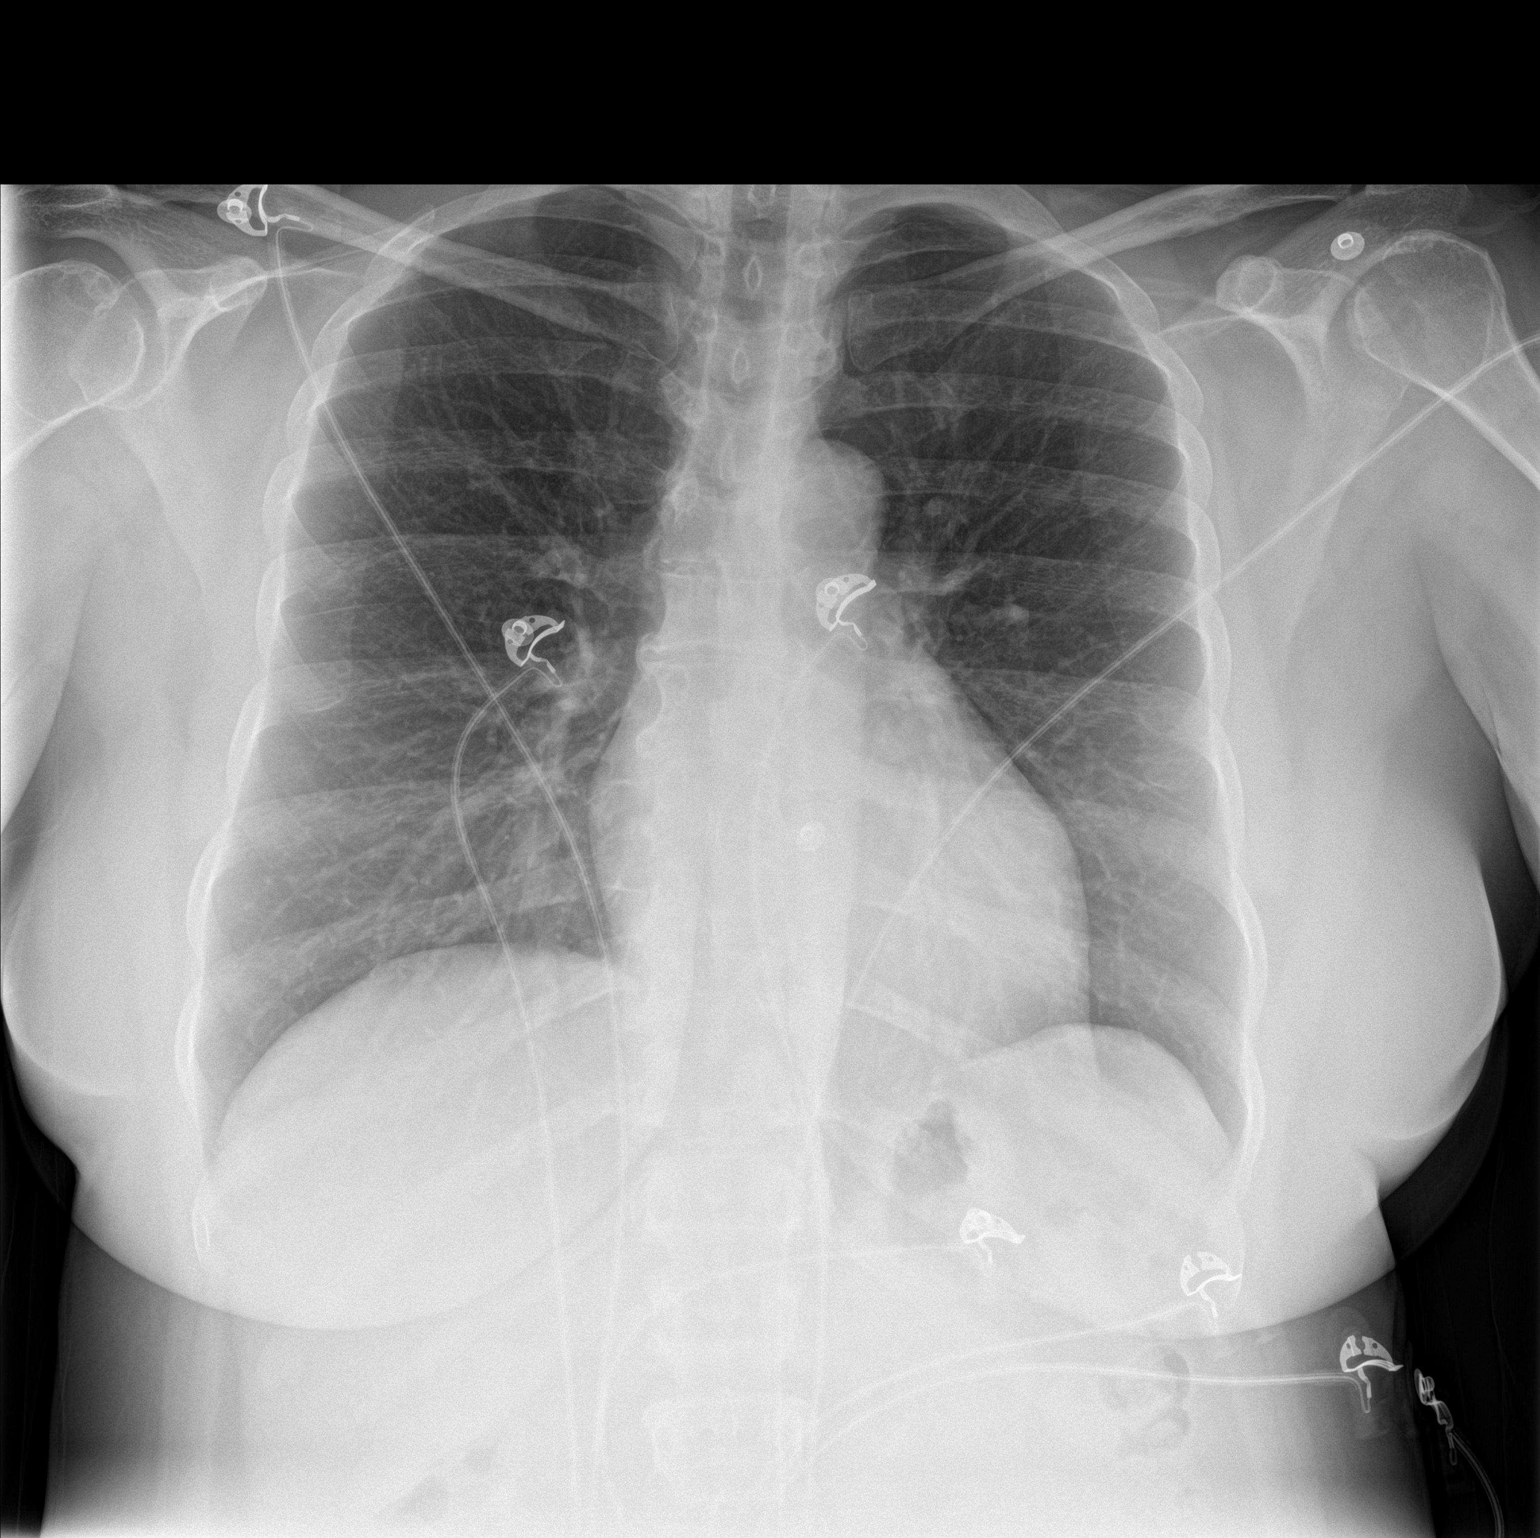

[chest lat]
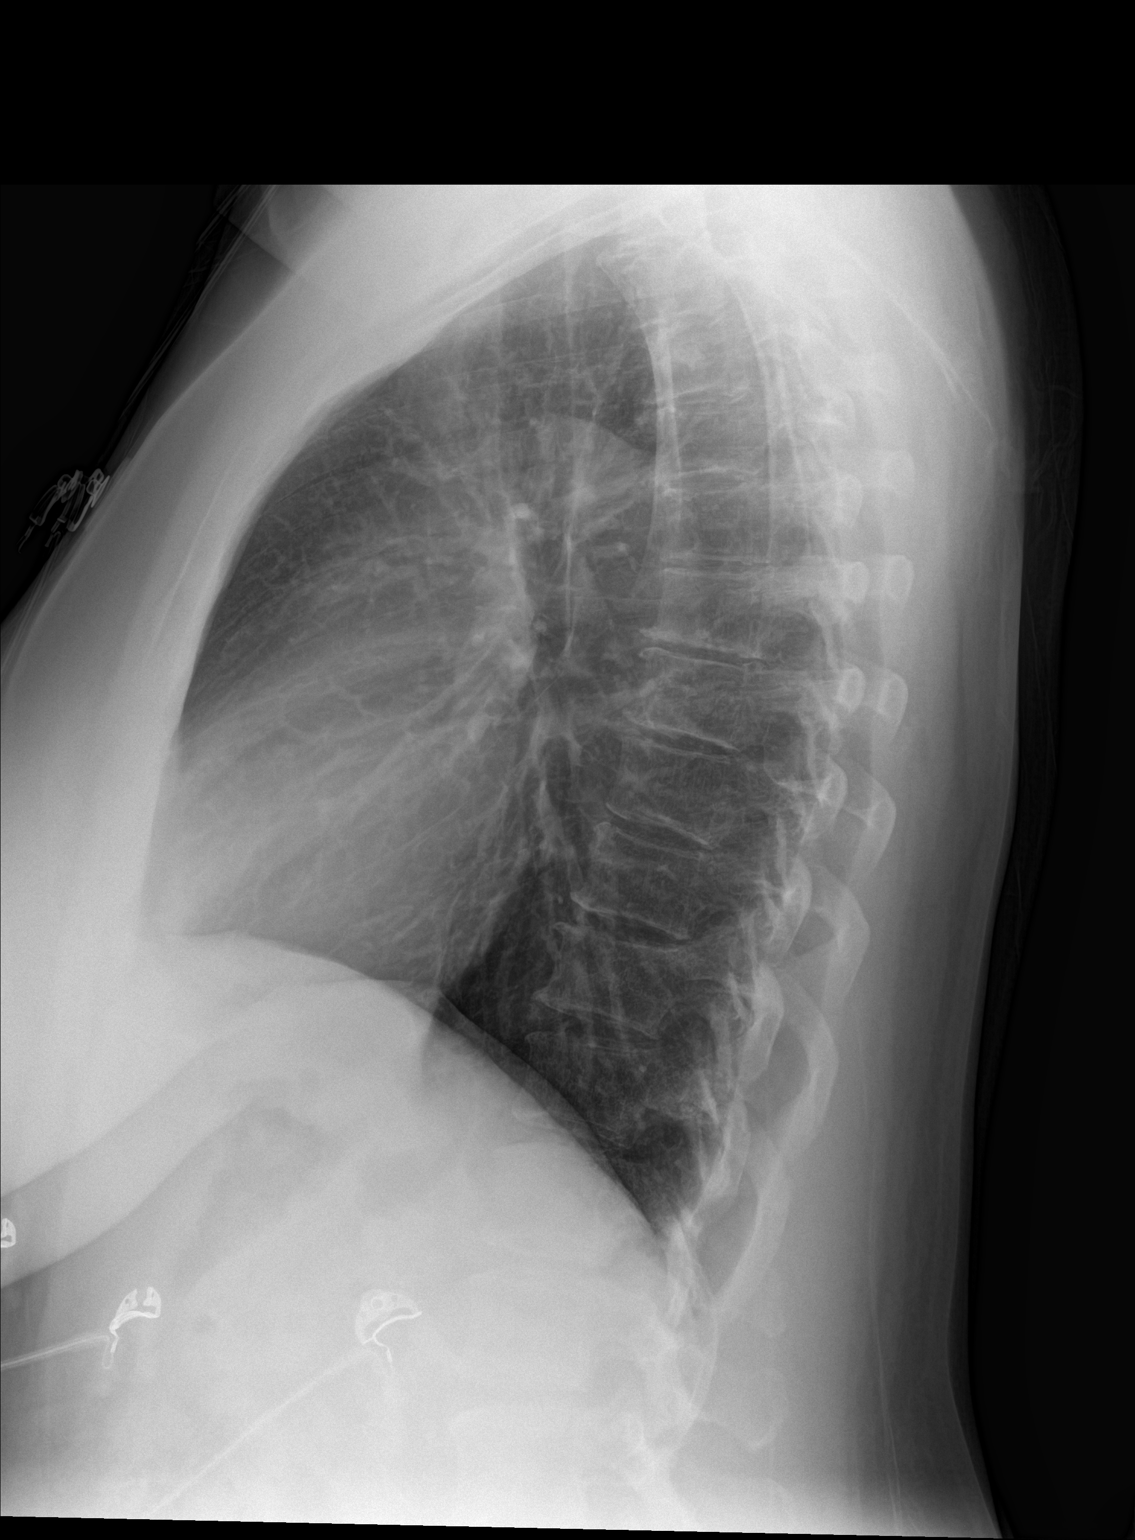

[2 of 2 positions shown; findings below may reference images not displayed]

FINDINGS: Upper normal heart size.

Mediastinal contours and pulmonary vascularity normal.

Atherosclerotic calcification aorta.

Lungs clear.

No pleural effusion or pneumothorax.

No hiatal hernia radiographically evident.

Minimal scattered endplate spur formation thoracic spine
IMPRESSION: No acute abnormalities.

## 2016-12-29 ENCOUNTER — Other Ambulatory Visit: Payer: Self-pay

## 2016-12-29 DIAGNOSIS — I10 Essential (primary) hypertension: Secondary | ICD-10-CM

## 2016-12-29 MED ORDER — LISINOPRIL 20 MG PO TABS: 20.0000 mg | ORAL_TABLET | Freq: Every day | ORAL | 0 refills | Status: DC

## 2017-01-24 IMAGING — US US ABDOMEN LIMITED
1 series · 14 of 25 positions shown · non-contrast
Comparison: None.

CLINICAL DATA: Acute onset of epigastric abdominal pain. Initial
encounter.

EXAM:
US ABDOMEN LIMITED - RIGHT UPPER QUADRANT

[Series 1: us abdomen limited · 0.18mm/px · 14 of 37 slices shown]
[im 1/37]
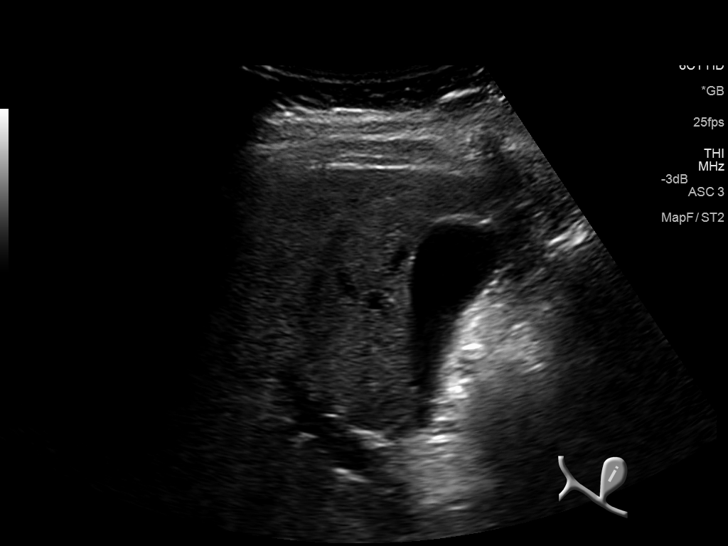
[im 4/37]
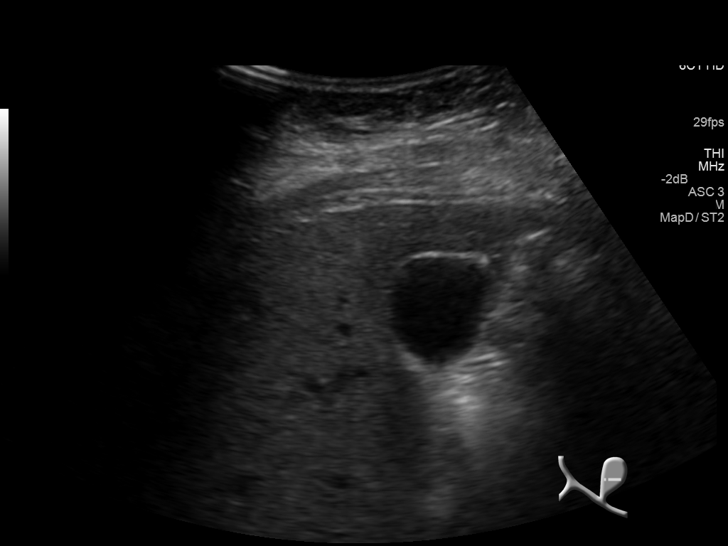
[im 7/37]
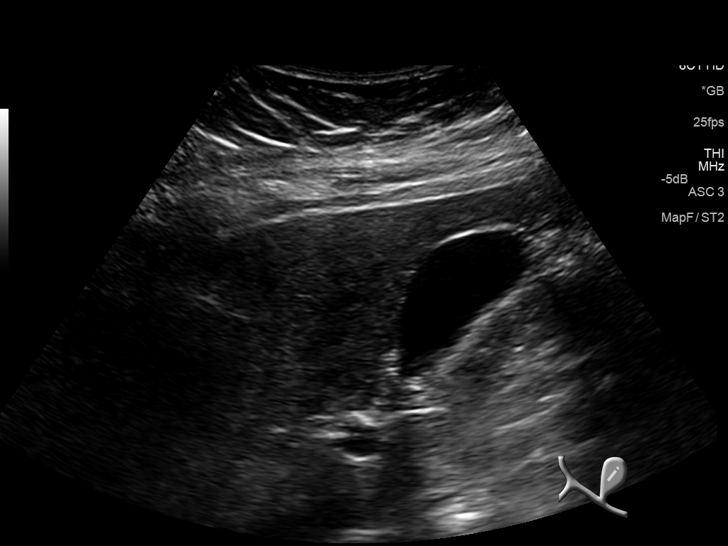
[im 10/37]
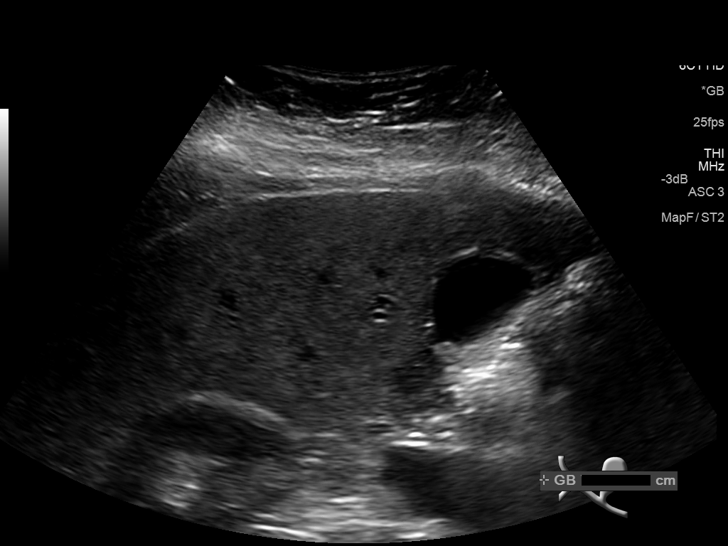
[im 13/37]
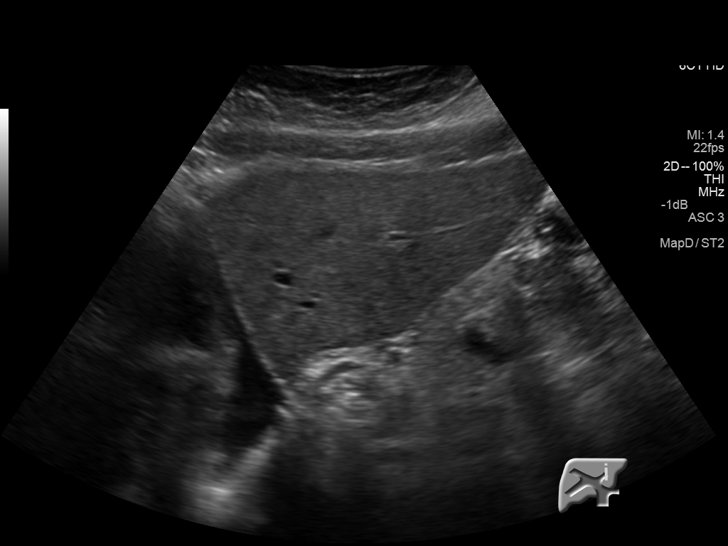
[im 14/37]
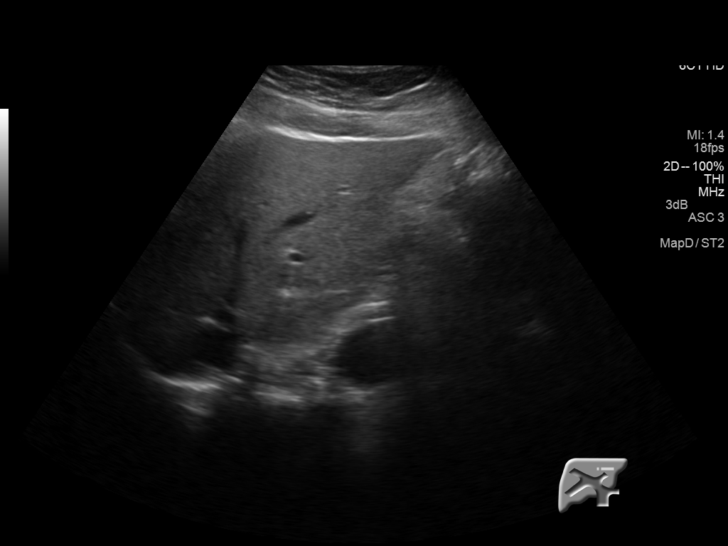
[im 17/37]
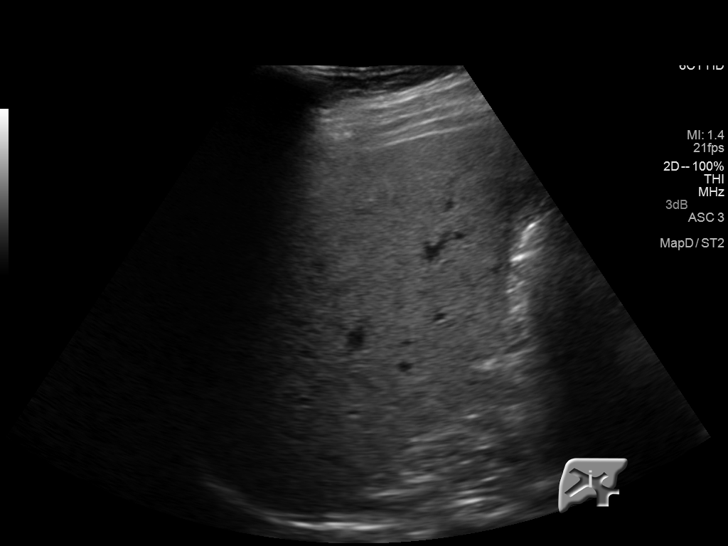
[im 20/37]
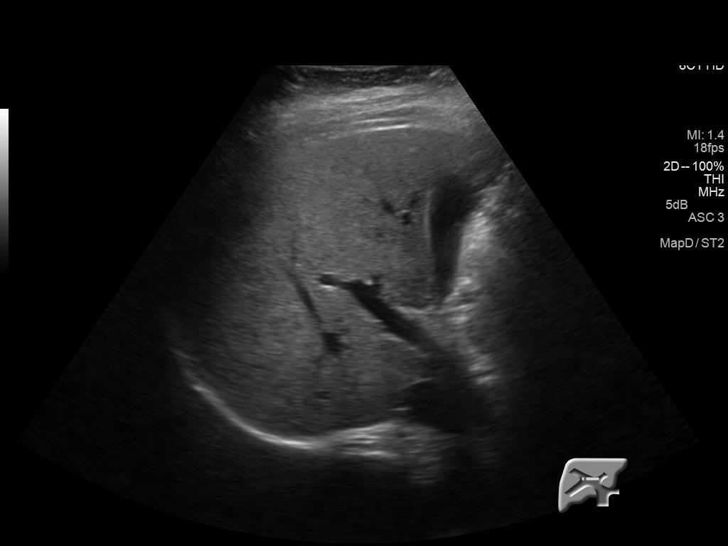
[im 23/37]
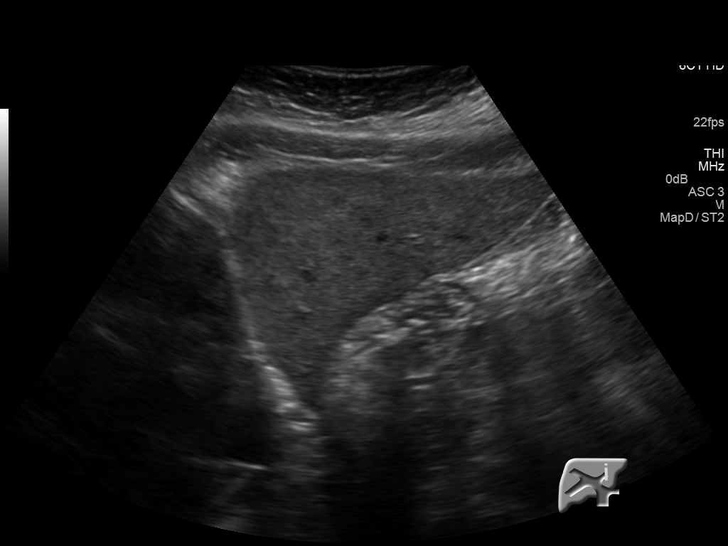
[im 25/37]
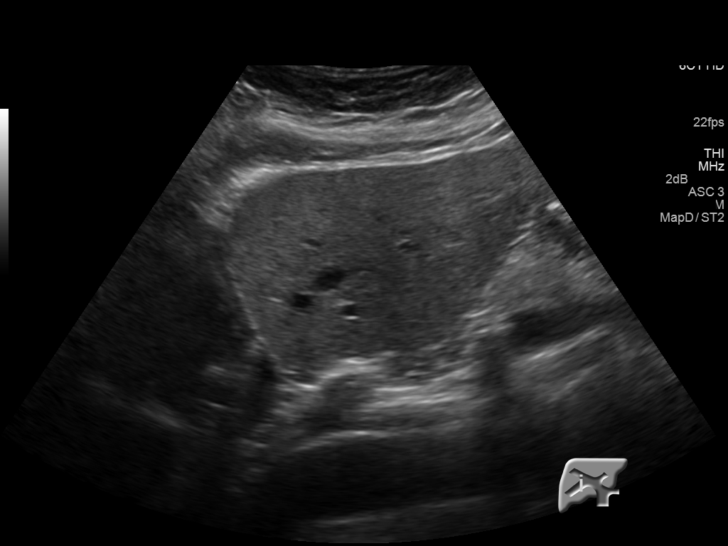
[im 28/37]
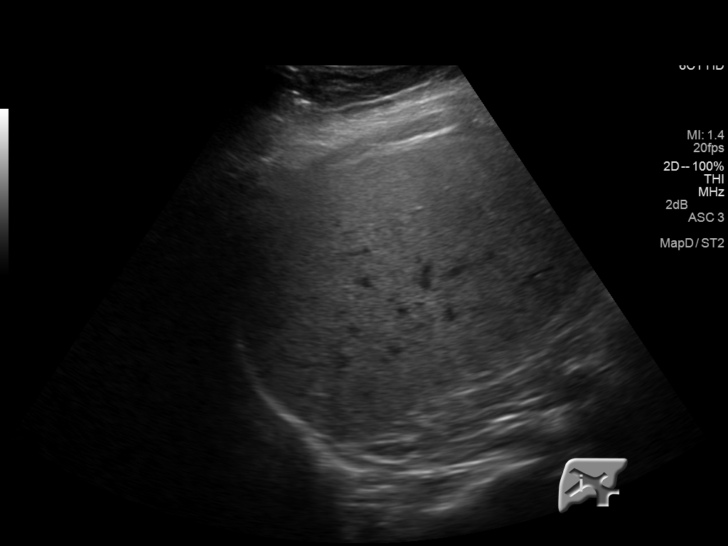
[im 31/37]
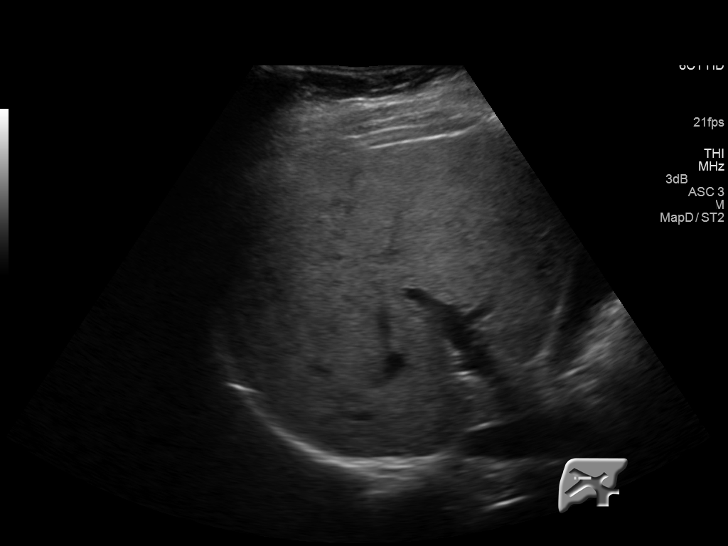
[im 34/37]
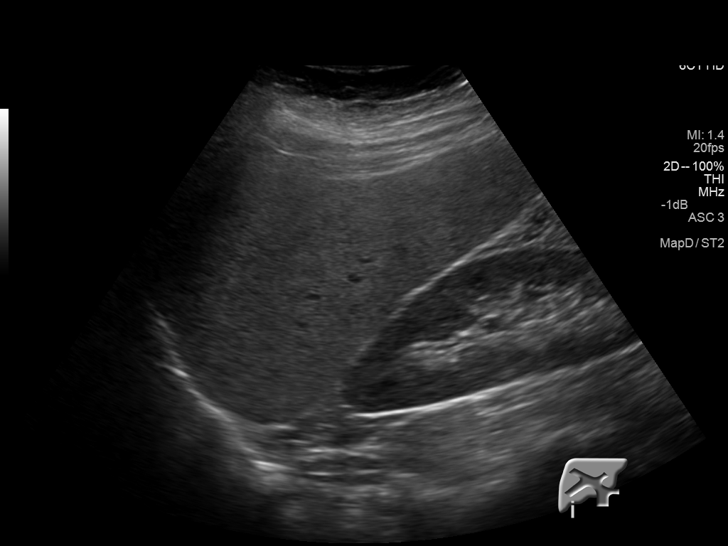
[im 37/37]
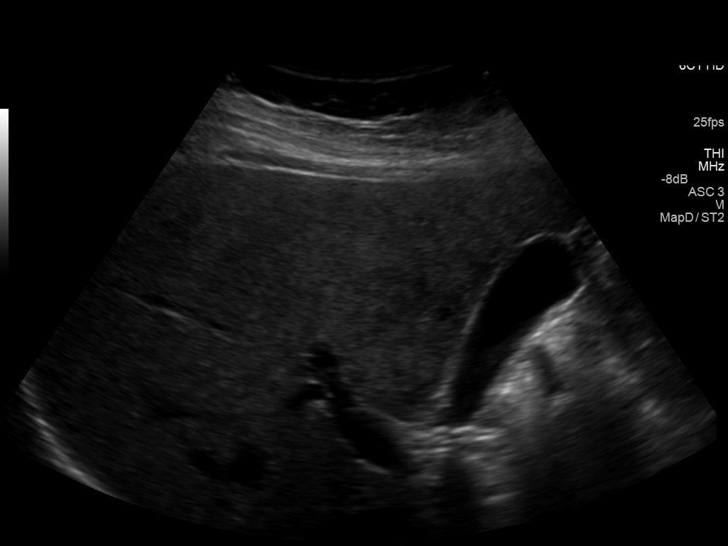

[14 of 25 positions shown; findings below may reference images not displayed]

FINDINGS: Gallbladder:

No gallstones or wall thickening visualized. No sonographic Murphy
sign noted.

Common bile duct:

Diameter: 0.4 cm, within normal limits in caliber.

Liver:

No focal lesion identified. Within normal limits in parenchymal
echogenicity.
IMPRESSION: Unremarkable ultrasound of the right upper quadrant.

## 2017-02-22 ENCOUNTER — Ambulatory Visit (INDEPENDENT_AMBULATORY_CARE_PROVIDER_SITE_OTHER): Payer: Managed Care, Other (non HMO) | Admitting: Nurse Practitioner

## 2017-02-22 ENCOUNTER — Ambulatory Visit: Payer: Managed Care, Other (non HMO) | Admitting: Nurse Practitioner

## 2017-02-22 ENCOUNTER — Encounter: Payer: Self-pay | Admitting: Nurse Practitioner

## 2017-02-22 VITALS — BP 133/73 | HR 83 | Temp 98.4°F | Ht 65.0 in | Wt 199.4 lb

## 2017-02-22 DIAGNOSIS — L659 Nonscarring hair loss, unspecified: Secondary | ICD-10-CM | POA: Diagnosis not present

## 2017-02-22 DIAGNOSIS — Z1239 Encounter for other screening for malignant neoplasm of breast: Secondary | ICD-10-CM

## 2017-02-22 DIAGNOSIS — Z1231 Encounter for screening mammogram for malignant neoplasm of breast: Secondary | ICD-10-CM | POA: Diagnosis not present

## 2017-02-22 DIAGNOSIS — Z23 Encounter for immunization: Secondary | ICD-10-CM

## 2017-02-22 DIAGNOSIS — E1165 Type 2 diabetes mellitus with hyperglycemia: Secondary | ICD-10-CM

## 2017-02-22 DIAGNOSIS — I10 Essential (primary) hypertension: Secondary | ICD-10-CM

## 2017-02-22 LAB — POCT GLYCOSYLATED HEMOGLOBIN (HGB A1C): Hemoglobin A1C: 7.4

## 2017-02-22 MED ORDER — LISINOPRIL 20 MG PO TABS: 20.0000 mg | ORAL_TABLET | Freq: Every day | ORAL | 0 refills | Status: DC

## 2017-02-22 MED ORDER — DAPAGLIFLOZIN PROPANEDIOL 5 MG PO TABS: 5.0000 mg | ORAL_TABLET | Freq: Every day | ORAL | 5 refills | Status: DC

## 2017-02-22 MED ORDER — LISINOPRIL 20 MG PO TABS: 20.0000 mg | ORAL_TABLET | Freq: Every day | ORAL | 1 refills | Status: DC

## 2017-02-22 MED ORDER — AMLODIPINE BESYLATE 10 MG PO TABS: 10.0000 mg | ORAL_TABLET | Freq: Every day | ORAL | 3 refills | Status: DC

## 2017-02-22 NOTE — Patient Instructions (Addendum)
Angela Griffith, Thank you for coming in to clinic today.  1. For your diabetes:  - START Farxiga 5 mg once daily  2. For your Hypertension:  - Continue amlodipine 10 mg once daily - Continue lisinopril 20 mg once daily.  Please schedule a follow-up appointment with your next PCP as soon as possible,  And by 3 months (around 05/22/2017) for diabetes and hypertension.  If you have any other questions or concerns, please feel free to call the clinic or send a message through [place]. You may also schedule an earlier appointment if necessary.  You will receive a survey after today's visit either digitally by e-mail or paper by [employer]. Your experiences and feedback matter to [place].  Please respond so we know how we are doing as we provide care for you.   Cassell Smiles, DNP, AGNP-BC Adult Gerontology Nurse Practitioner [facility]    Diabetes Mellitus and Standards of Medical Care Managing diabetes (diabetes mellitus) can be complicated. Your diabetes treatment may be managed by a team of health care providers, including:  A diet and nutrition specialist (registered dietitian).  A nurse.  A certified diabetes educator (CDE).  A diabetes specialist (endocrinologist).  An eye doctor.  A primary care provider.  A dentist.  Your health care providers follow a schedule in order to help you get the best quality of care. The following schedule is a general guideline for your diabetes management plan. Your health care providers may also give you more specific instructions. HbA1c ( hemoglobin A1c) test This test provides information about blood sugar (glucose) control over the previous 2-3 months. It is used to check whether your diabetes management plan needs to be adjusted.  If you are meeting your treatment goals, this test is done at least 2 times a year.  If you are not meeting treatment goals or if your treatment goals have changed, this test is done 4 times a  year.  Blood pressure test  This test is done at every routine medical visit. For most people, the goal is less than 130/80. Ask your health care provider what your goal blood pressure should be. Dental and eye exams  Visit your dentist two times a year.  If you have type 1 diabetes, get an eye exam 3-5 years after you are diagnosed, and then once a year after your first exam. ? If you were diagnosed with type 1 diabetes as a child, get an eye exam when you are age 50 or older and have had diabetes for 3-5 years. After the first exam, you should get an eye exam once a year.  If you have type 2 diabetes, have an eye exam as soon as you are diagnosed, and then once a year after your first exam. Foot care exam  Visual foot exams are done at every routine medical visit. The exams check for cuts, bruises, redness, blisters, sores, or other problems with the feet.  A complete foot exam is done by your health care provider once a year. This exam includes an inspection of the structure and skin of your feet, and a check of the pulses and sensation in your feet. ? Type 1 diabetes: Get your first exam 3-5 years after diagnosis. ? Type 2 diabetes: Get your first exam as soon as you are diagnosed.  Check your feet every day for cuts, bruises, redness, blisters, or sores. If you have any of these or other problems that are not healing, contact your  health care provider. Kidney function test ( urine microalbumin)  This test is done once a year. ? Type 1 diabetes: Get your first test 5 years after diagnosis. ? Type 2 diabetes: Get your first test as soon as you are diagnosed.  If you have chronic kidney disease (CKD), get a serum creatinine and estimated glomerular filtration rate (eGFR) test once a year. Lipid profile (cholesterol, HDL, LDL, triglycerides)  This test should be done when you are diagnosed with diabetes, and every 5 years after the first test. If you are on medicines to lower your  cholesterol, you may need to get this test done every year. ? The goal for LDL is less than 100 mg/dL (5.5 mmol/L). If you are at high risk, the goal is less than 70 mg/dL (3.9 mmol/L). ? The goal for HDL is 40 mg/dL (2.2 mmol/L) for men and 50 mg/dL(2.8 mmol/L) for women. An HDL cholesterol of 60 mg/dL (3.3 mmol/L) or higher gives some protection against heart disease. ? The goal for triglycerides is less than 150 mg/dL (8.3 mmol/L). Immunizations  The yearly flu (influenza) vaccine is recommended for everyone 6 months or older who has diabetes.  The pneumonia (pneumococcal) vaccine is recommended for everyone 2 years or older who has diabetes. If you are 59 or older, you may get the pneumonia vaccine as a series of two separate shots.  The hepatitis B vaccine is recommended for adults shortly after they have been diagnosed with diabetes.  The Tdap (tetanus, diphtheria, and pertussis) vaccine should be given: ? According to normal childhood vaccination schedules, for children. ? Every 10 years, for adults who have diabetes.  The shingles vaccine is recommended for people who have had chicken pox and are 50 years or older. Mental and emotional health  Screening for symptoms of eating disorders, anxiety, and depression is recommended at the time of diagnosis and afterward as needed. If your screening shows that you have symptoms (you have a positive screening result), you may need further evaluation and be referred to a mental health care provider. Diabetes self-management education  Education about how to manage your diabetes is recommended at diagnosis and ongoing as needed. Treatment plan  Your treatment plan will be reviewed at every medical visit. Summary  Managing diabetes (diabetes mellitus) can be complicated. Your diabetes treatment may be managed by a team of health care providers.  Your health care providers follow a schedule in order to help you get the best quality of  care.  Standards of care including having regular physical exams, blood tests, blood pressure monitoring, immunizations, screening tests, and education about how to manage your diabetes.  Your health care providers may also give you more specific instructions based on your individual health. This information is not intended to replace advice given to you by your health care provider. Make sure you discuss any questions you have with your health care provider. Document Released: 11/02/2008 Document Revised: 10/04/2015 Document Reviewed: 10/04/2015 Elsevier Interactive Patient Education  [employer].

## 2017-02-22 NOTE — Progress Notes (Signed)
Subjective:    Patient ID: Angela Griffith, female    DOB: 1960-04-14, 57 y.o.   MRN: [id number]  Angela Griffith is a 57 y.o. female presenting on 02/22/2017 for Diabetes  Pt was discharged from practice 2/2 too many no-show appointments.  Pt did not receive termination letter, so will provide care for next 30 days until pt can establish care with a new PCP.  Letter provided to pt today by Oneal Grout, CMA - Practice administrator.  HPI Hypertension  - She is checking BP at home or outside of clinic.  Readings 130s/70-80 - Current medications: lisinopril and amlodipine, tolerating well without side effects - She is not currently symptomatic. - Pt denies headache, dizziness, changes in vision, chest tightness/pressure, palpitations, leg swelling, sudden loss of speech or loss of consciousness. - She  reports no regular exercise routine. - Her diet is low in salt, low in fat, and moderate in carbohydrates.   Diabetes Pt presents today for follow up of Type 2 diabetes mellitus. She is not checking CBG at home - Current diabetic medications include: metformin - She did not start farxiga as previously requested. - She is not currently symptomatic.  - She denies polydipsia, polyphagia, polyuria, headaches, diaphoresis, shakiness, chills, pain, numbness or tingling in extremities and changes in vision.   - Clinical course has been stable. - She  reports no regular exercise routine. - Her diet is moderate in salt, moderate in fat, and high in carbohydrates. - Weight trend: increasing steadily  PREVENTION: Eye exam current (within one year): no Foot exam current (within one year): yes Lipid/ASCVD risk reduction - on statin: declines Kidney protection - on ace or arb: on ACEi for htn Recent Labs    05/22/16 1041 08/31/16 1532 02/22/17 1627  HGBA1C 7.6* 7.7 7.4    Social History   Tobacco Use  . Smoking status: Former Smoker    Packs/day: 0.25    Years: 34.00    Pack  years: 8.50    Types: Cigarettes    Last attempt to quit: 01/19/2014    Years since quitting: 3.1  . Smokeless tobacco: Never Used  Substance Use Topics  . Alcohol use: No    Alcohol/week: 0.0 oz  . Drug use: No    Review of Systems Per HPI unless specifically indicated above     Objective:    BP 133/73 (BP Location: Left Arm, Patient Position: Sitting, Cuff Size: Normal)   Pulse 83   Temp 98.4 F (36.9 C) (Oral)   Ht 5\' 5"  (1.651 m)   Wt 199 lb 6.4 oz (90.4 kg)   BMI 33.18 kg/m   Wt Readings from Last 3 Encounters:  02/22/17 199 lb 6.4 oz (90.4 kg)  08/31/16 196 lb 12.8 oz (89.3 kg)  05/22/16 197 lb 12.8 oz (89.7 kg)    Physical Exam  General - obese, well-appearing, NAD HEENT - Normocephalic, atraumatic, alopecia noted on top of head Neck - supple, non-tender, no LAD, no thyromegaly, no carotid bruit Heart - RRR, no murmurs heard Lungs - Clear throughout all lobes, no wheezing, crackles, or rhonchi. Normal work of breathing. Extremeties - non-tender, no edema, cap refill < 2 seconds, peripheral pulses intact +2 bilaterally Skin - warm, dry Neuro - awake, alert, oriented x3, normal gait Psych - Normal mood and affect, normal behavior   Results for orders placed or performed in visit on 02/22/17  Comprehensive metabolic panel  Result Value Ref Range   Glucose, Bld 138 (H)  65 - 99 mg/dL   BUN 14 7 - 25 mg/dL   Creat 9.600.76 4.540.50 - 0.981.05 mg/dL   BUN/Creatinine Ratio NOT APPLICABLE 6 - 22 (calc)   Sodium 140 135 - 146 mmol/L   Potassium 4.1 3.5 - 5.3 mmol/L   Chloride 106 98 - 110 mmol/L   CO2 26 20 - 32 mmol/L   Calcium 9.3 8.6 - 10.4 mg/dL   Total Protein 6.6 6.1 - 8.1 g/dL   Albumin 3.9 3.6 - 5.1 g/dL   Globulin 2.7 1.9 - 3.7 g/dL (calc)   AG Ratio 1.4 1.0 - 2.5 (calc)   Total Bilirubin 0.6 0.2 - 1.2 mg/dL   Alkaline phosphatase (APISO) 76 33 - 130 U/L   AST 10 10 - 35 U/L   ALT 12 6 - 29 U/L  TSH  Result Value Ref Range   TSH 0.47 0.40 - 4.50 mIU/L  POCT  glycosylated hemoglobin (Hb A1C)  Result Value Ref Range   Hemoglobin A1C 7.4       Assessment & Plan:   Problem List Items Addressed This Visit      Cardiovascular and Mediastinum   Hypertension    Slightly above goal, but near goal and previously controlled and stable.  Medications well tolerated.  Plan: 1. Continue amlodipine 10 mg daily and lisinopril 20 mg daily 2. Continue work on diet.  Discussed DASH diet.  Goal is to choose 1-2 items from the diet to incorporate each week until diet looks mostly like the DASH diet. 3. Increase physical activity to 30 minutes most days of the week. 4. Follow up 3 months with next PCP.      Relevant Medications   amLODipine (NORVASC) 10 MG tablet   lisinopril (PRINIVIL,ZESTRIL) 20 MG tablet   Other Relevant Orders   Comprehensive metabolic panel (Completed)     Endocrine   Diabetes mellitus without complication (HCC) - Primary    Uncontrolled, but slightly improved with decrease from 7.7 - 7.4% since last visit.  Pt continues to have decreased knowledge of disease process, but has increased slightly since last visit.  Plan: 1. Discussion of diabetes pathophysiology, disease process.  Pt concern is hypoglycemia and side effects of medication. 2. Lifestyle modifications.  DASH diet will assist glucose control as well.  Increase physical activity to 30 minutes most days of the week. 3. START taking Farxiga 5 mg once daily.  Pt continues to verbalize she will start med. 4. Start checking CBG fasting in am daily.  5. Follow up 3 months with new PCP.      Relevant Medications   lisinopril (PRINIVIL,ZESTRIL) 20 MG tablet   canagliflozin (INVOKANA) 300 MG TABS tablet    Other Visit Diagnoses    Breast cancer screening     Pt last mammogram > 2 years ago.  Plan: 1. Screening mammogram order placed.  Pt will call to schedule appointment.  Information given.    Relevant Orders   MM DIGITAL SCREENING BILATERAL   Hair loss     Pt reports  continued alopecia, progressing over last several years.   Plan: 1. Check TSH 2. Dermatology for bald spots if TSH is normal. 3. Followup with next PCP   Relevant Orders   TSH (Completed)   Ambulatory referral to Dermatology   Need for diphtheria-tetanus-pertussis (Tdap) vaccine     Administer Tdap today.  Pt last vaccine > 10 years ago.   Relevant Orders   Tdap vaccine greater than or equal to 7yo IM (  Completed)      Meds ordered this encounter  Medications  . DISCONTD: lisinopril (PRINIVIL,ZESTRIL) 20 MG tablet    Sig: Take 1 tablet (20 mg total) by mouth daily.    Dispense:  90 tablet    Refill:  0    Must have appointment prior to additional refills.    Order Specific Question:   Supervising Provider    Answer:   Smitty Cords [[id number]]  . amLODipine (NORVASC) 10 MG tablet    Sig: Take 1 tablet (10 mg total) by mouth daily.    Dispense:  90 tablet    Refill:  3    Order Specific Question:   Supervising Provider    Answer:   Smitty Cords [[id number]]  . lisinopril (PRINIVIL,ZESTRIL) 20 MG tablet    Sig: Take 1 tablet (20 mg total) by mouth daily.    Dispense:  90 tablet    Refill:  1    Must have appointment prior to additional refills.    Order Specific Question:   Supervising Provider    Answer:   Smitty Cords [[id number]]  . DISCONTD: dapagliflozin propanediol (FARXIGA) 5 MG TABS tablet    Sig: Take 5 mg by mouth daily.    Dispense:  30 tablet    Refill:  5    Order Specific Question:   Supervising Provider    Answer:   Smitty Cords [[id number]]  . canagliflozin (INVOKANA) 300 MG TABS tablet    Sig: Take 1 tablet (300 mg total) by mouth daily before breakfast.    Dispense:  30 tablet    Refill:  5    Order Specific Question:   Supervising Provider    Answer:   Smitty Cords [[id number]]      Follow up plan: Return in about 3 months (around 05/22/2017) for diabetes and hypertension.   [name], DNP, AGPCNP-BC Adult  Gerontology Primary Care Nurse Practitioner [facility] 02/26/2017, 10:26 AM

## 2017-02-23 LAB — TSH: TSH: 0.47 mIU/L (ref 0.40–4.50)

## 2017-02-24 LAB — COMPREHENSIVE METABOLIC PANEL
AG Ratio: 1.4 (calc) (ref 1.0–2.5)
ALT: 12 U/L (ref 6–29)
AST: 10 U/L (ref 10–35)
Albumin: 3.9 g/dL (ref 3.6–5.1)
Alkaline phosphatase (APISO): 76 U/L (ref 33–130)
BUN: 14 mg/dL (ref 7–25)
CO2: 26 mmol/L (ref 20–32)
Calcium: 9.3 mg/dL (ref 8.6–10.4)
Chloride: 106 mmol/L (ref 98–110)
Creat: 0.76 mg/dL (ref 0.50–1.05)
Globulin: 2.7 g/dL (calc) (ref 1.9–3.7)
Glucose, Bld: 138 mg/dL — ABNORMAL HIGH (ref 65–99)
Potassium: 4.1 mmol/L (ref 3.5–5.3)
Sodium: 140 mmol/L (ref 135–146)
Total Bilirubin: 0.6 mg/dL (ref 0.2–1.2)
Total Protein: 6.6 g/dL (ref 6.1–8.1)

## 2017-02-25 MED ORDER — CANAGLIFLOZIN 300 MG PO TABS: 300.0000 mg | ORAL_TABLET | Freq: Every day | ORAL | 5 refills | Status: AC

## 2017-02-26 ENCOUNTER — Encounter: Payer: Self-pay | Admitting: Nurse Practitioner

## 2017-02-26 NOTE — Assessment & Plan Note (Signed)
Uncontrolled, but slightly improved with decrease from 7.7 - 7.4% since last visit.  Pt continues to have decreased knowledge of disease process, but has increased slightly since last visit.  Plan: 1. Discussion of diabetes pathophysiology, disease process.  Pt concern is hypoglycemia and side effects of medication. 2. Lifestyle modifications.  DASH diet will assist glucose control as well.  Increase physical activity to 30 minutes most days of the week. 3. START taking Farxiga 5 mg once daily.  Pt continues to verbalize she will start med. 4. Start checking CBG fasting in am daily.  5. Follow up 3 months with new PCP.

## 2017-02-26 NOTE — Assessment & Plan Note (Signed)
Slightly above goal, but near goal and previously controlled and stable.  Medications well tolerated.  Plan: 1. Continue amlodipine 10 mg daily and lisinopril 20 mg daily 2. Continue work on diet.  Discussed DASH diet.  Goal is to choose 1-2 items from the diet to incorporate each week until diet looks mostly like the DASH diet. 3. Increase physical activity to 30 minutes most days of the week. 4. Follow up 3 months with next PCP.

## 2017-12-20 ENCOUNTER — Other Ambulatory Visit: Payer: Self-pay | Admitting: Nurse Practitioner

## 2017-12-20 DIAGNOSIS — E1165 Type 2 diabetes mellitus with hyperglycemia: Secondary | ICD-10-CM

## 2017-12-20 DIAGNOSIS — I10 Essential (primary) hypertension: Secondary | ICD-10-CM

## 2018-01-18 ENCOUNTER — Other Ambulatory Visit: Payer: Self-pay | Admitting: Nurse Practitioner

## 2018-01-18 DIAGNOSIS — E1165 Type 2 diabetes mellitus with hyperglycemia: Secondary | ICD-10-CM

## 2018-01-18 DIAGNOSIS — I10 Essential (primary) hypertension: Secondary | ICD-10-CM

## 2018-02-02 ENCOUNTER — Other Ambulatory Visit: Payer: Self-pay | Admitting: Nurse Practitioner

## 2018-02-02 DIAGNOSIS — I10 Essential (primary) hypertension: Secondary | ICD-10-CM

## 2018-02-02 DIAGNOSIS — E1165 Type 2 diabetes mellitus with hyperglycemia: Secondary | ICD-10-CM

## 2018-03-01 ENCOUNTER — Other Ambulatory Visit: Payer: Self-pay | Admitting: Nurse Practitioner

## 2018-03-01 DIAGNOSIS — I10 Essential (primary) hypertension: Secondary | ICD-10-CM

## 2018-06-17 ENCOUNTER — Other Ambulatory Visit: Payer: Self-pay | Admitting: Nurse Practitioner

## 2018-06-17 DIAGNOSIS — E119 Type 2 diabetes mellitus without complications: Secondary | ICD-10-CM

## 2018-06-20 MED ORDER — FREESTYLE FREEDOM LITE W/DEVICE KIT: PACK | 0 refills | Status: AC

## 2018-11-07 ENCOUNTER — Other Ambulatory Visit: Payer: Self-pay | Admitting: Nurse Practitioner

## 2018-11-07 DIAGNOSIS — I10 Essential (primary) hypertension: Secondary | ICD-10-CM

## 2018-11-13 ENCOUNTER — Other Ambulatory Visit: Payer: Self-pay | Admitting: Nurse Practitioner

## 2018-11-13 DIAGNOSIS — I10 Essential (primary) hypertension: Secondary | ICD-10-CM

## 2019-01-23 ENCOUNTER — Other Ambulatory Visit: Payer: Self-pay | Admitting: Family Medicine

## 2019-01-23 DIAGNOSIS — E119 Type 2 diabetes mellitus without complications: Secondary | ICD-10-CM

## 2022-04-16 ENCOUNTER — Emergency Department
Admission: EM | Admit: 2022-04-16 | Discharge: 2022-04-16 | Disposition: A | Discharge status: Home / Self Care | Payer: BC Managed Care – PPO | Attending: Emergency Medicine | Admitting: Emergency Medicine

## 2022-04-16 ENCOUNTER — Other Ambulatory Visit: Payer: Self-pay

## 2022-04-16 ENCOUNTER — Encounter: Payer: Self-pay | Admitting: *Deleted

## 2022-04-16 ENCOUNTER — Emergency Department: Payer: BC Managed Care – PPO

## 2022-04-16 DIAGNOSIS — Y92410 Unspecified street and highway as the place of occurrence of the external cause: Secondary | ICD-10-CM | POA: Diagnosis not present

## 2022-04-16 DIAGNOSIS — M542 Cervicalgia: Secondary | ICD-10-CM | POA: Diagnosis not present

## 2022-04-16 DIAGNOSIS — S0990XA Unspecified injury of head, initial encounter: Secondary | ICD-10-CM | POA: Diagnosis not present

## 2022-04-16 DIAGNOSIS — M546 Pain in thoracic spine: Secondary | ICD-10-CM | POA: Insufficient documentation

## 2022-04-16 MED ORDER — ONDANSETRON 4 MG PO TBDP: 4.0000 mg | ORAL_TABLET | Freq: Three times a day (TID) | ORAL | 0 refills | Status: AC | PRN

## 2022-04-16 MED ORDER — HYDROCODONE-ACETAMINOPHEN 5-325 MG PO TABS: 1.0000 | ORAL_TABLET | Freq: Four times a day (QID) | ORAL | 0 refills | Status: AC | PRN
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
